# Patient Record
Sex: Male | Born: 1945
Health system: Southern US, Community
[De-identification: ages and names within clinical notes are randomized; demographics above are authoritative.]

## PROBLEM LIST (undated history)

## (undated) DIAGNOSIS — E119 Type 2 diabetes mellitus without complications: Secondary | ICD-10-CM

## (undated) DIAGNOSIS — F419 Anxiety disorder, unspecified: Secondary | ICD-10-CM

## (undated) DIAGNOSIS — K219 Gastro-esophageal reflux disease without esophagitis: Secondary | ICD-10-CM

## (undated) DIAGNOSIS — C801 Malignant (primary) neoplasm, unspecified: Secondary | ICD-10-CM

## (undated) DIAGNOSIS — E039 Hypothyroidism, unspecified: Secondary | ICD-10-CM

## (undated) DIAGNOSIS — Z8719 Personal history of other diseases of the digestive system: Secondary | ICD-10-CM

## (undated) DIAGNOSIS — I10 Essential (primary) hypertension: Secondary | ICD-10-CM

## (undated) HISTORY — PX: BACK SURGERY: SHX140

---

## 1989-02-22 HISTORY — PX: FRACTURE SURGERY: SHX138

## 1990-02-22 HISTORY — PX: HERNIA REPAIR: SHX51

## 2000-06-20 ENCOUNTER — Ambulatory Visit (HOSPITAL_COMMUNITY): Admission: RE | Admit: 2000-06-20 | Discharge: 2000-06-20 | Payer: Self-pay | Admitting: Gastroenterology

## 2004-06-01 ENCOUNTER — Ambulatory Visit (HOSPITAL_COMMUNITY): Admission: RE | Admit: 2004-06-01 | Discharge: 2004-06-02 | Payer: Self-pay | Admitting: Neurosurgery

## 2005-03-08 ENCOUNTER — Encounter: Admission: RE | Admit: 2005-03-08 | Discharge: 2005-06-06 | Payer: Self-pay | Admitting: Neurosurgery

## 2006-05-09 ENCOUNTER — Inpatient Hospital Stay (HOSPITAL_COMMUNITY): Admission: RE | Admit: 2006-05-09 | Discharge: 2006-05-10 | Payer: Self-pay | Admitting: Neurosurgery

## 2007-01-21 ENCOUNTER — Encounter: Admission: RE | Admit: 2007-01-21 | Discharge: 2007-01-21 | Payer: Self-pay | Admitting: Neurosurgery

## 2007-04-25 ENCOUNTER — Ambulatory Visit (HOSPITAL_COMMUNITY): Admission: RE | Admit: 2007-04-25 | Discharge: 2007-04-26 | Payer: Self-pay | Admitting: Neurosurgery

## 2007-06-30 ENCOUNTER — Encounter: Admission: RE | Admit: 2007-06-30 | Discharge: 2007-06-30 | Payer: Self-pay | Admitting: Neurosurgery

## 2007-07-13 ENCOUNTER — Observation Stay (HOSPITAL_COMMUNITY): Admission: RE | Admit: 2007-07-13 | Discharge: 2007-07-14 | Payer: Self-pay | Admitting: Neurosurgery

## 2009-10-16 ENCOUNTER — Ambulatory Visit (HOSPITAL_COMMUNITY): Admission: RE | Admit: 2009-10-16 | Discharge: 2009-10-17 | Payer: Self-pay | Admitting: Neurosurgery

## 2010-05-08 LAB — URINALYSIS, ROUTINE W REFLEX MICROSCOPIC
Bilirubin Urine: NEGATIVE
Glucose, UA: NEGATIVE mg/dL
Ketones, ur: NEGATIVE mg/dL
Nitrite: NEGATIVE
Protein, ur: NEGATIVE mg/dL
Specific Gravity, Urine: 1.019 (ref 1.005–1.030)
pH: 6 (ref 5.0–8.0)

## 2010-05-08 LAB — CBC
Hemoglobin: 13.8 g/dL (ref 13.0–17.0)
MCH: 29.7 pg (ref 26.0–34.0)
MCHC: 33.9 g/dL (ref 30.0–36.0)
MCV: 87.7 fL (ref 78.0–100.0)
RBC: 4.64 MIL/uL (ref 4.22–5.81)
RDW: 14.1 % (ref 11.5–15.5)
WBC: 5.5 10*3/uL (ref 4.0–10.5)

## 2010-05-08 LAB — GLUCOSE, CAPILLARY
Glucose-Capillary: 109 mg/dL — ABNORMAL HIGH (ref 70–99)
Glucose-Capillary: 162 mg/dL — ABNORMAL HIGH (ref 70–99)
Glucose-Capillary: 259 mg/dL — ABNORMAL HIGH (ref 70–99)
Glucose-Capillary: 278 mg/dL — ABNORMAL HIGH (ref 70–99)

## 2010-05-08 LAB — SURGICAL PCR SCREEN: Staphylococcus aureus: POSITIVE — AB

## 2010-05-08 LAB — BASIC METABOLIC PANEL
Creatinine, Ser: 1.2 mg/dL (ref 0.4–1.5)
GFR calc Af Amer: >60 mL/min (ref >60)
Potassium: 4.4 mEq/L (ref 3.5–5.1)
Sodium: 136 mEq/L (ref 135–145)

## 2010-05-08 LAB — PROTIME-INR: INR: 0.97 (ref 0.00–1.49)

## 2010-07-07 NOTE — Op Note (Signed)
[name], [name] NO.:  [id number]   MEDICAL RECORD NO.:  [id number]          PATIENT TYPE:  OBV   LOCATION:  3537                         FACILITY:  [facility]   PHYSICIAN:  Clydene Fake, M.D.  DATE OF BIRTH:  09-23-1945   DATE OF PROCEDURE:  07/13/2007  DATE OF DISCHARGE:                               OPERATIVE REPORT   PREOPERATIVE DIAGNOSIS:  Cord compression myelopathy, C4-C5 and C5-C6.   POSTOPERATIVE DIAGNOSIS:  Cord compression myelopathy, C4-C5 and C5-C6.   PROCEDURES:  Anterior cervical decompression and diskectomy and fusion  at C4-C5 and C5-C6 with LifeNet allograft bone, Trestle anterior  cervical plate.   SURGEON:  Clydene Fake, MD   ASSISTANT:  Dr. Ezzard Standing.   ANESTHESIA:  General endotracheal tube anesthesia.   ESTIMATED BLOOD LOSS:  Minimal.   BLOOD GIVEN:  None.   DRAINS:  None.   COMPLICATIONS:  None.   REASON FOR PROCEDURE:  The patient is a 65 year old gentleman who began  to have progressive trouble walking, clumsiness, and increasing  spasticity.  MRI of the C-spine shows spondylytic change, disk  herniation, central cord compression at C4-C5 and C5-C6 with cord  change.  The patient was brought in for decompression and fusion.   PROCEDURE IN DETAIL:  The patient was brought to the operating room, and  general anesthesia was induced.  The patient was placed in a 10-pound  Halter traction, prepped and draped in sterile fashion, a standard  incision, was injected with 10 mL of 1% lidocaine with epinephrine.  Incision was then made from the midline to the anterior border of the  sternocleidomastoid muscle on the left side.  Neck was incision taken  down to the platysma, and hemostasis was obtained with Bovie  cauterization.  The platysma was incised with Bovie and blunt dissection  taken through the anterior cervical fascia.  The anterior cervical spine  needle was placed in interspace.  X-ray was obtained showing this was  at  L4-L5 interspace.  Interspaces were incised with #15 blade and partial  diskectomy performed, and the needle was removed.  Longus coli muscles  were reflected laterally from C4 through C6, and self-retaining  retractors were placed, and diskectomy was continued at C4-C5 and C5-C6.  Anterior osteophytes were removed with rongeurs, and distraction pins  were placed in the C4 and C6.  Interspace was distracted.  Microscope  was brought in for microdissection.  Diskectomy was continued at C4-C5.  A high-speed drill was used to remove cartilaginous endplate and  osteophytes even 1 mm to 2 mm.  Kerrison rongeurs were used to remove  the posterior disk osteophyte and ligament decompressing the central  canal.  Bilateral foraminotomies were performed.  We had good  decompression of the central canal.  We measured height of disk space to  be 4 mm, and a 4-mm LifeNet allograft bone was tapped into place and  countersunk a few millimeters.  Attention was directed to C5-C6, and we  used a high-speed drill to remove cartilaginous endplates and  osteophytes and actually a few millimeters of the C5 vertebra __________  posteriorly.  We used Kerrison rongeurs to remove posterior disk  ligament and osteophytes, removing fragments of disk that were  __________ compressing the cord.  Some of these were slightly cephalad,  and we also pulled that out with a nerve hook.  When we were finished,  we had good central decompression and bilateral nerve roots were well  decompressed.  We measured the height of the disk space to 7 mm at each  level and a 7-mm left allograft bone was tapped into place.  Weight was  removed from traction.  Distraction was removed and distraction pins  removed.  Hemostasis was obtained with Gelfoam and thrombin binding.  Plates were __________ in good position.  Trestle anterior cervical  plate was placed over the anterior cervical spine.  Two screws placed in  C5 and two in the  C4, C5, and C6.  These were tightened down.  Lateral x-  rays were obtained showing good position of plate and screws, bone plug  at C4-C5, but we could not see C5-C6 graft due to body habitus and it  looked good intraoperatively.  Retractors removed.  Hemostasis obtained  with bipolar cauterization, Gelfoam, and thrombin.  Gelfoam was  irrigated out.  We irrigated with antibiotic solution.  We had good  hemostasis, and the platysma was closed with 3-0 Vicryl interrupted  sutures.  Subcutaneous tissue was closed with same.  Skin closed with  benzoin and Steri-Strips.  Dressing was placed.  The patient was placed  in a soft cervical collar, awoken from anesthesia, and transferred to  the recovery room in a stable condition.           ______________________________  Clydene Fake, M.D.     JRH/MEDQ  D:  07/13/2007  T:  07/14/2007  Job:  [id number]

## 2010-07-07 NOTE — Op Note (Signed)
[name], [name]             ACCOUNT NO.:  [id number]   MEDICAL RECORD NO.:  [id number]          PATIENT TYPE:  AMB   LOCATION:  SDS                          FACILITY:  [facility]   PHYSICIAN:  Clydene Fake, M.D.  DATE OF BIRTH:  [birth year]   DATE OF PROCEDURE:  DATE OF DISCHARGE:                               OPERATIVE REPORT   POSTOPERATIVE DIAGNOSIS:  Lumbar stenosis, spondylosis, prior surgery.   POSTOPERATIVE DIAGNOSES:  Lumbar stenosis, spondylosis, prior surgery.   PROCEDURE:  Redo bilateral decompressive laminectomies, foraminotomies  at L2-3, microdissection with microscope.   SURGEON:  Clydene Fake, M.D.   ANESTHESIA:  General endotracheal tube anesthesia.   ESTIMATED BLOOD LOSS:  Minimal.   BLOOD GIVEN:  None.   COMPLICATIONS:  None.   REASON FOR PROCEDURE:  The patient is a 65 year old gentleman who has  had 2 lumbar laminectomies in the past, doing well until recently.  Has  been having progressive neurogenic claudication with leg pain.Marland Kitchen  Epidural injections have helped but __________  not given any lasting  relief.  X-rays showed no instability with some multilevel degeneration  and an MRI shows severe stenosis at the L2-3 level.  The patient is  brought in for decompression.   PROCEDURE IN DETAIL:  The patient was brought to the operating room and  general anesthesia induced.  The patient was placed in supine position  in a Wilson frame with all pressure points padded.  The patient was  prepped and draped in sterile fashion.  The subcutaneous tissue was  injected with 10 mL 0.25% Marcaine and 10 mL lidocaine with epinephrine.  An incision was then made at the site of previous scar incision and the  upper aspect of it taken down to the fascia.  Hemostasis was obtained  Bovie cauterization.  The fascia was incised on the left side and  subperiosteal dissection was carried down to the L2 and L3 spinous  process, __________  facet.  A marker was placed at  the interspace.  X-  rays were obtained that confirmed our position at L2-3.  Then we exposed  the contralateral side and placed a self-retaining retractor.  The  microscope was brought in for microdissection at this point and a high-  speed drill was used to start a semi hemilaminectomy on the left side at  L2-L3 and a medial facetectomy was also performed.  Kerrison punches  then were used to finish this.  The ligament flavum was removed.  L3 was  also removed.  Foraminotomy done over the 3 root.  We decompressed the  deep canal.  There was significant lateral recess stenosis throughout  and when we were finished we had good decompression.  We explored the  nerve roots.  The 3 nerve was well decompressed.  The foramen was very  narrow.  We continued our decompression foraminotomy to decompress the 2  nerve root.  We used curettes also to decompress the foramen and when  were finished we had a good decompression of the L2 root.  Going out at  its foramen was a disk bulge there.  We did not enter the disk space.  Attention was then taken to the right side.  A semi hemilaminectomy was  performed, decompressing the thecal sac at the underside.  Foraminotomies performed and we __________  canal and the 2 and 3 nerve  roots were decompressed.  When we were finished we had good central and  lateral decompression.  The central canal and 2 and 3 roots were well  decompressed.  We irrigated with antibiotic solution.  We had good  hemostasis.  Fascia closed with 0 Vicryl interrupted sutures.  Subcutaneous tissue closed with 2-0 and 3-0 Vicryl interrupted suture.  Skin closed with benzoin.  Steri-Strips and a dressing was placed.  The  patient was placed back in the supine position  and returned to the  recovery room in stable condition.           ______________________________  Clydene Fake, M.D.     JRH/MEDQ  D:  04/25/2007  T:  04/25/2007  Job:  [id number]

## 2010-07-10 NOTE — Op Note (Signed)
[name], [name] NO.:  [id number]   MEDICAL RECORD NO.:  [id number]          PATIENT TYPE:  OIB   LOCATION:  2899                         FACILITY:  [facility]   PHYSICIAN:  Clydene Fake, M.D.  DATE OF BIRTH:  05-Mar-1945   DATE OF PROCEDURE:  06/01/2004  DATE OF DISCHARGE:                                 OPERATIVE REPORT   DIAGNOSIS:  Herniated nucleus pulposus right L3-4.   POSTOPERATIVE DIAGNOSIS:  Herniated nucleus pulposus right L3-4.   PROCEDURE:  Right L3-4 semihemilanectomy and diskectomy, microdissection  with microscope.   SURGEON:  Clydene Fake, M.D.   ASSISTANT:  Cristi Loron, M.D.   ANESTHESIA:  General endotracheal tube anesthesia.   ESTIMATED BLOOD LOSS:  Minimal.   BLOOD GIVEN:  None.   DRAINS:  None.   COMPLICATIONS:  None.   REASON FOR PROCEDURE:  The patient is a 65 year old gentleman who has been  having back and right leg pain radiating in the L4 distribution, with  positive straight leg raising and decreased deep tendon reflexes of the  right knee, and MRI showing spondylitic changes of L3-4 and broad-based disc  bulge and a large extruded fragment to the right side.  The patient was  brought in for diskectomy.   PROCEDURE IN DETAIL:  The patient was brought into the operating room and  general anesthesia induced.  The patient was placed in a prone position on  the Wilson frame, with all pressure points padded.  The patient was prepped  and draped in a sterile fashion.  The site of incision was injected with 10  cc of 1% lidocaine with epinephrine.  A needle was placed in the interspace.  X-ray was obtained, showing this was pointing at the L3-4 interspace.  Incision was then made centered where the needle was placed.  Incision was  taken down to the fascia.  Hemostasis was obtained with Bovie cauterization.  Subperiosteal dissection was done over the L3 and L4 spinous processes and  lamina out to the facet.  A  self-retaining retractor was placed.  Marker was  placed at the interspace.  Another x-ray was obtained confirming our  positioning at L3-4.  Microscope was brought in for microdissection.  A high-  speed drill was used to start a semihemilaminectomy, and medial facetectomy  was completed with Kerrison punches.  The ligamentum flavum was removed and  a  foraminotomy was done over the L4 root.  We explored the epidural space,  and the extruded fragment was seen up under the L4 root.  Nerve root  retractor was used, and we started removing the disc herniation which was  friable while it went up the sucker and came out in small pieces, but we  continued the decompression.  We decompressed up to the disc space.  The  disc space was then incised with a 15 blade and diskectomy performed with  pituitary rongeurs and curets.  Osteophytes were used to remove some  osteophytes.  When we were finished, we had a good decompression of the  central canal and the L4 root, and the L3  root was checked and that was  decompression.  We felt around under the L4 root and more medially behind  the dura and posterior to the L4 vertebral body and could not find any more  fragments of disc.  Hemostasis was obtained with bipolar cauterization and  Gelfoam with thrombin.  Gelfoam was irrigated out.  We irrigated with  antibiotics solution.  We had good hemostasis.  The retractor was removed,  and the fascia closed with 0 Vicryl interrupted suture, the subcutaneous  tissue closed with 0, 2-0, and 3-0 Vicryl interrupted suture, and the skin  closed with Benzoin and Steri-Strips.  Dressing was placed.  The patient was  placed back to a supine position, awakened from anesthesia, and transferred  to the recovery room in stable condition.      JRH/MEDQ  D:  06/01/2004  T:  06/01/2004  Job:  [id number]

## 2010-07-10 NOTE — Op Note (Signed)
[place]  Patient:    [name], [name]                    MRN: [id number] Proc. Date: 06/20/00 Adm. Date:  [id number] Attending:  Nelda Marseille CC:         Petra Kuba, M.D.  Veverly Fells. Althe[place], M.D.   Operative Report  PROCEDURE PERFORMED:  Colonoscopy.  ENDOSCOPIST:  Petra Kuba, M.D.  INDICATIONS FOR PROCEDURE:  Patient due for colonic screening with increased gas and bloating.  Consent was signed after risks, benefits, methods, and options were thoroughly discussed multiple times in the past.  MEDICATIONS USED:  Demerol 75 mg, Versed 7.5 mg.  DESCRIPTION OF PROCEDURE:  Rectal inspection was pertinent for external hemorrhoids.  Digital exam was negative.  The video colonoscope was inserted and easily advanced around the colon to the cecum.  This did not require any abdominal pressure or any position changes.  The cecum was identified by the appendiceal orifice and the ileocecal valve.  In fact, the scope was inserted a short ways to the terminal ileum which was normal.  Photodocumentation was obtained.  The scope was then slowly withdrawn.  The right side of the colon had a fair prep with stool adherent to the wall which could not be washed off, but no large lesions were seen.  The scope was then slowly withdrawn.  The rest of the prep was adequate but on slow withdrawal back to the rectum, no abnormalities were seen, specifically no diverticula, polyps, masses or signs of colitis.  Once back in the rectum, the scope was retroflexed revealing some tiny internal hemorrhoids.  The scope was straightened and readvanced a short ways up the sigmoid.  Air was suctioned, scope removed.  The patient tolerated the procedure well.  There was no obvious immediate complication.  ENDOSCOPIC DIAGNOSIS: 1. Internal and external hemorrhoids. 2. Otherwise within normal limits to the terminal ileum.  PLAN:  Await trial of Donnatal instead  of the Librax.  Would increase things like Gas-X and Phazyme since they help.  We discussed how multiple of his medicines or just the diabetes may be playing a role but repeat screening in five to 10 years.  I will be happy to see back p.r.n. or in two months to see how the Donnatal is working and make sure no further work-up plans are needed. DD:  06/20/00 TD:  06/20/00 Job: [id number]

## 2010-07-10 NOTE — Op Note (Signed)
NAMEBRYLAN, DEC NO.:  [id number]   MEDICAL RECORD NO.:  [id number]          PATIENT TYPE:  INP   LOCATION:  5041                         FACILITY:  [facility]   PHYSICIAN:  Clydene Fake, M.D.  DATE OF BIRTH:  [birth year]   DATE OF PROCEDURE:  05/09/2006  DATE OF DISCHARGE:                               OPERATIVE REPORT   DIAGNOSIS:  Left L2-3 far-lateral herniated nucleus pulposus.   POSTOPERATIVE DIAGNOSES:  Left L2-3 far-lateral herniated nucleus  pulposus.   PROCEDURE:  Left L2-3 far-lateral diskectomy, microsurgical microscope.   SURGEON:  Clydene Fake, M.D.   ASSISTANT:  Hilda Lias, M.D.   ANESTHESIA:  General endotracheal tube anesthesia.   ESTIMATED BLOOD LOSS:  Minimal.   BLOOD GIVEN:  None.   DRAINS:  None.   COMPLICATIONS:  None.   REASON FOR PROCEDURE:  The patient is a 65 year old gentleman who a  couple of years ago underwent a right 3-4 diskectomy and developed back  and left leg pain, weakness and numbness.  A MRI was done showing a  large bilateral disk herniation 2-3 on the left compressing the 2 root.  The patient is brought in for decompression.   PROCEDURE IN DETAIL:  The patient was brought to the operating room and  general anesthesia induced.  The patient was placed in the prone  position on the Wilson frame with all pressure points padded.  The  patient was prepped and draped in a sterile fashion.  The site of  incision was injected with 20 mL of 1% lidocaine with epinephrine.  An  incision was then made at the site of the previous scar and extended  cephalad for another level incision taken down through the fascia.  Hemostasis was obtained with Bovie cauterization.  The fascia was  incised over the L2 spinous processes and subperiosteal dissection was  done over the spinous processes and laminae up to the facets, dissected  out over the pars and a superior marker was placed below the alignment.  X-ray was taken  showing that our superior marker was at the L1-2 disk  space pointing at the pedicle of L2 and the bottom marker was at the L2-  3 interspace pointing at the L3 pedicle and it showed to be at the right  position.  I dissected a little more laterally until I could see the  pars, and a high-speed drill was used to drill down the pars and the  superior lateral facet slightly.  The microscope was brought in for  microdissection.  At this point,  Kerrison punches were used to continue  dissection through the lateral part of the pars which was dissected with  nerve hooks through the fascia and L2 nerve root.  We decompressed the  ligaments over the nerve root and then decompressed both medially and  inferiorly and then dissecting down out of the disk space which had a  large subligamentous disk herniation causing a lot of pressure on the L2  root.  We opened up the disk space and removed large fragments of disk  herniation and even pulled some  more fragments out from behind medially  which was superior to the disk space and more fragments out laterally.  Doing this, we were able to decompress the L2 root.  Dissected down to  the disk space, did not end with the disk space, but continued to remove  __________ disk space disk material.  When we were finished, we had good  decompression of L2 root.  We did follow it going medial and laterally  with nerve root showing no compression of the root.  After hemostasis  with Gelfoam and thrombin, we irrigated that out with antibiotic  solution.  When we had good hemostasis, the retractors were removed.  The fascia closed with 0 Vicryl interrupted suture.  The subcutaneous  tissue closed with 2-0 and 3-0 Vicryl interrupted sutures.  Skin closed  with benzoin and Steri-Strips.  Dressing was placed.  The patient was  placed back in the supine position, awakened from anesthesia and  transferred to the recovery room in stable condition.            ______________________________  Clydene Fake, M.D.     JRH/MEDQ  D:  05/09/2006  T:  05/09/2006  Job:  [id number]

## 2010-11-16 LAB — URINALYSIS, ROUTINE W REFLEX MICROSCOPIC
Bilirubin Urine: NEGATIVE
Glucose, UA: 500 — AB
Ketones, ur: 15 — AB
Specific Gravity, Urine: 1.025
pH: 6

## 2010-11-16 LAB — CBC
HCT: 49.1
Hemoglobin: 16.4
MCV: 82.2
Platelets: 206
RDW: 15.8 — ABNORMAL HIGH

## 2010-11-16 LAB — BASIC METABOLIC PANEL
BUN: 23
CO2: 26
Chloride: 99
Glucose, Bld: 232 — ABNORMAL HIGH
Potassium: 4.4

## 2010-11-18 LAB — BASIC METABOLIC PANEL
BUN: 14
Chloride: 99
GFR calc non Af Amer: >60
Glucose, Bld: 83
Potassium: 3.8
Sodium: 138

## 2010-11-18 LAB — URINALYSIS, ROUTINE W REFLEX MICROSCOPIC
Glucose, UA: NEGATIVE
Ketones, ur: NEGATIVE
Nitrite: NEGATIVE
Specific Gravity, Urine: 1.016
pH: 7.5

## 2010-11-18 LAB — CBC
HCT: 47.1
Hemoglobin: 15.7
MCV: 84.3
Platelets: 239
WBC: 7

## 2010-11-18 LAB — APTT: aPTT: 26

## 2014-02-22 HISTORY — PX: EYE SURGERY: SHX253

## 2017-08-01 ENCOUNTER — Encounter (HOSPITAL_COMMUNITY): Payer: Self-pay | Admitting: Emergency Medicine

## 2017-08-01 ENCOUNTER — Emergency Department (HOSPITAL_COMMUNITY): Payer: Medicare Other

## 2017-08-01 ENCOUNTER — Other Ambulatory Visit: Payer: Self-pay

## 2017-08-01 ENCOUNTER — Emergency Department (HOSPITAL_COMMUNITY)
Admission: EM | Admit: 2017-08-01 | Discharge: 2017-08-01 | Disposition: A | Discharge status: Home / Self Care | Payer: Medicare Other | Attending: Emergency Medicine | Admitting: Emergency Medicine

## 2017-08-01 DIAGNOSIS — M1990 Unspecified osteoarthritis, unspecified site: Secondary | ICD-10-CM | POA: Diagnosis not present

## 2017-08-01 DIAGNOSIS — X500XXA Overexertion from strenuous movement or load, initial encounter: Secondary | ICD-10-CM | POA: Insufficient documentation

## 2017-08-01 DIAGNOSIS — Y9389 Activity, other specified: Secondary | ICD-10-CM | POA: Diagnosis not present

## 2017-08-01 DIAGNOSIS — S4992XA Unspecified injury of left shoulder and upper arm, initial encounter: Secondary | ICD-10-CM | POA: Diagnosis present

## 2017-08-01 DIAGNOSIS — Y999 Unspecified external cause status: Secondary | ICD-10-CM | POA: Diagnosis not present

## 2017-08-01 DIAGNOSIS — Y929 Unspecified place or not applicable: Secondary | ICD-10-CM | POA: Diagnosis not present

## 2017-08-01 DIAGNOSIS — E119 Type 2 diabetes mellitus without complications: Secondary | ICD-10-CM | POA: Insufficient documentation

## 2017-08-01 DIAGNOSIS — S46002A Unspecified injury of muscle(s) and tendon(s) of the rotator cuff of left shoulder, initial encounter: Secondary | ICD-10-CM | POA: Diagnosis not present

## 2017-08-01 DIAGNOSIS — I10 Essential (primary) hypertension: Secondary | ICD-10-CM | POA: Diagnosis not present

## 2017-08-01 HISTORY — DX: Malignant (primary) neoplasm, unspecified: C80.1

## 2017-08-01 HISTORY — DX: Essential (primary) hypertension: I10

## 2017-08-01 HISTORY — DX: Type 2 diabetes mellitus without complications: E11.9

## 2017-08-01 MED ORDER — METHOCARBAMOL 500 MG PO TABS: 500.0000 mg | ORAL_TABLET | Freq: Two times a day (BID) | ORAL | 0 refills | Status: DC

## 2017-08-01 NOTE — ED Provider Notes (Signed)
Patient placed in Quick Look pathway, seen and evaluated   Chief Complaint: left shoulder and arm pain  HPI:  [name] is a 72 y.o. male with hx of HTN, DM who presents to the ED with left shoulder and arm pain that started 3 days ago after he picked up his grandchild that weighs 30 pounds. The pain has been persistent since then. The pain increases with movement. Patient reports taking NSAIDS that has helped some. Patient reports having similar pain in the same area before but pain went away with Aleve.   ROS: M/S: left shoulder and arm pain  Physical Exam:  BP (!) 153/72 (BP Location: Left Arm)   Pulse 94   Temp 98.5 F (36.9 C) (Oral)   Resp 16   Ht 5\' 8"  (1.727 m)   Wt 88.5 kg (195 lb)   SpO2 96%   BMI 29.65 kg/m    Gen: No distress  Neuro: Awake and Alert  Skin: Warm and dry  M/S: Radial pulse 2+, adequate circulation, pain with palpation and range of motion of the left shoulder over the [facility].   Initiation of care has begun. The patient has been counseled on the process, plan, and necessity for staying for the completion/evaluation, and the remainder of the medical screening examination    Ashley Murrain, NP 08/01/17 [place], [place], MD 08/03/17 [phone]

## 2017-08-01 NOTE — ED Notes (Signed)
Patient transported to X-ray 

## 2017-08-01 NOTE — ED Notes (Signed)
See EDP assessment / note.   

## 2017-08-01 NOTE — ED Notes (Signed)
Pt verbalized understanding discharge instructions and denies any further needs or questions at this time. VS stable, ambulatory and steady gait.   

## 2017-08-01 NOTE — ED Triage Notes (Signed)
Pt c/o pain in left shoulder and arm onset 2 days ago.  Describes pain as sharp.  Pain increases with movement and when laying down.  Pt st's he tried to pick up his 30lb granddaughter prior to pain starting

## 2017-08-01 NOTE — ED Provider Notes (Signed)
[facility] Provider Note   CSN: [id number] Arrival date & time: 08/01/17  1457     History   Chief Complaint Chief Complaint  Patient presents with  . Shoulder Pain    HPI [name] is a 72 y.o. male.  Pt comes in with c/o left shoulder pain that started 2 days ago after lifting his granddaughter. Denies numbness or weakness. Has taken aleve and ibuprofen with minimal relief. Denies any problems with rom. He has had the pain before but it usually resolves with aleve     Past Medical History:  Diagnosis Date  . Cancer ([place])   . Diabetes mellitus without complication ([place])   . Hypertension     There are no active problems to display for this patient.   Past Surgical History:  Procedure Laterality Date  . BACK SURGERY    . FRACTURE SURGERY          Home Medications    Prior to Admission medications   Not on File    Family History No family history on file.  Social History Social History   Tobacco Use  . Smoking status: Never Smoker  . Smokeless tobacco: Never Used  Substance Use Topics  . Alcohol use: Never    Frequency: Never  . Drug use: Never     Allergies   Codeine and Morphine and related   Review of Systems Review of Systems  All other systems reviewed and are negative.    Physical Exam Updated Vital Signs BP (!) 153/72 (BP Location: Left Arm)   Pulse 94   Temp 98.5 F (36.9 C) (Oral)   Resp 16   Ht 5\' 8"  (1.727 m)   Wt 88.5 kg (195 lb)   SpO2 96%   BMI 29.65 kg/m   Physical Exam  Constitutional: He is oriented to person, place, and time. He appears well-developed and well-nourished.  Eyes: EOM are normal.  Cardiovascular: Normal rate.  Pulmonary/Chest: Effort normal and breath sounds normal.  Abdominal: Soft. Bowel sounds are normal.  Musculoskeletal: Normal range of motion.  Tender to the ac joint and posterior shoulder. Pt has full rom  Neurological: He is alert and  oriented to person, place, and time.  Skin: Skin is warm and dry.  Psychiatric: He has a normal mood and affect.  Nursing note and vitals reviewed.    ED Treatments / Results  Labs (all labs ordered are listed, but only abnormal results are displayed) Labs Reviewed - No data to display  EKG None  Radiology Dg Shoulder Left  Result Date: 08/01/2017 CLINICAL DATA:  72 year old male with history of left shoulder pain for the past 3 weeks. No history of injury. EXAM: LEFT SHOULDER - 2+ VIEW COMPARISON:  No priors. FINDINGS: There is no evidence of fracture or dislocation. Degenerative changes of osteoarthritis are noted in the glenohumeral and acromioclavicular joints. Humeral head is high riding, suggesting underlying rotator cuff pathology. Density at the inferior aspect of the glenoid labrum. Soft tissues are unremarkable. IMPRESSION: 1. Density along the inferior aspect of the glenoid labrum, likely chronic, suggestive of remote labral injury, or possible loose body in the joint. This could be further evaluated with nonemergent MRI of the shoulder if clinically appropriate. 2. Negative for acute fracture or dislocation. 3. High-riding humeral head, suggestive of rotator cuff pathology. 4. Osteoarthritis in glenohumeral and acromioclavicular joints. Electronically Signed   By: Vinnie Langton M.D.   On: 08/01/2017 16:30  Procedures Procedures (including critical care time)  Medications Ordered in ED Medications - No data to display   Initial Impression / Assessment and Plan / ED Course  I have reviewed the triage vital signs and the nursing notes.  Pertinent labs & imaging results that were available during my care of the patient were reviewed by me and considered in my medical decision making (see chart for details).     Discussed finding with pt. Discussed follow up with ortho and pcp  Final Clinical Impressions(s) / ED Diagnoses   Final diagnoses:  None    ED Discharge  Orders    None       Glendell Docker, NP 08/01/17 1707    Nat Christen, MD 08/03/17 1037

## 2017-09-12 ENCOUNTER — Other Ambulatory Visit: Payer: Self-pay

## 2017-09-12 ENCOUNTER — Emergency Department (HOSPITAL_COMMUNITY)
Admission: EM | Admit: 2017-09-12 | Discharge: 2017-09-12 | Disposition: A | Discharge status: Home / Self Care | Payer: Medicare Other | Attending: Emergency Medicine | Admitting: Emergency Medicine

## 2017-09-12 ENCOUNTER — Emergency Department (HOSPITAL_COMMUNITY): Payer: Medicare Other

## 2017-09-12 ENCOUNTER — Encounter (HOSPITAL_COMMUNITY): Payer: Self-pay | Admitting: Emergency Medicine

## 2017-09-12 DIAGNOSIS — M79604 Pain in right leg: Secondary | ICD-10-CM | POA: Diagnosis present

## 2017-09-12 DIAGNOSIS — I10 Essential (primary) hypertension: Secondary | ICD-10-CM | POA: Diagnosis not present

## 2017-09-12 DIAGNOSIS — M5431 Sciatica, right side: Secondary | ICD-10-CM | POA: Insufficient documentation

## 2017-09-12 DIAGNOSIS — E119 Type 2 diabetes mellitus without complications: Secondary | ICD-10-CM | POA: Insufficient documentation

## 2017-09-12 MED ORDER — METHOCARBAMOL 500 MG PO TABS: 500.0000 mg | ORAL_TABLET | Freq: Two times a day (BID) | ORAL | 0 refills | Status: DC

## 2017-09-12 MED ORDER — HYDROCODONE-ACETAMINOPHEN 5-325 MG PO TABS: 1.0000 | ORAL_TABLET | Freq: Four times a day (QID) | ORAL | 0 refills | Status: DC | PRN

## 2017-09-12 NOTE — ED Notes (Signed)
ED Provider at bedside. 

## 2017-09-12 NOTE — ED Notes (Signed)
Pt verbalized understanding of discharge instructions and denies any further questions at this time.   

## 2017-09-12 NOTE — ED Triage Notes (Signed)
Pt. Stated, Im having rt. Leg pain since yesterday, I also have a rod in this leg. It doesn't hurt unless Im moving it.
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
## 2017-09-12 NOTE — Discharge Instructions (Addendum)
Take Robaxin and Norco as needed as prescribed for pain. Do not drive while taking these medications. Apply a warm compress to your lower back for 20 minutes at a time. Follow up with your doctor.  Your x-ray today is normal, sciatica is a problem with muscle spasms and nerve compression.  The medications prescribed today should help relax the muscles as well as help with your pain however you should follow-up with your family doctor to recheck and discuss possible physical therapy.

## 2017-09-12 NOTE — ED Notes (Signed)
Patient transported to X-ray 

## 2017-11-30 ENCOUNTER — Other Ambulatory Visit: Payer: Self-pay | Admitting: Family Medicine

## 2017-12-01 ENCOUNTER — Other Ambulatory Visit: Payer: Self-pay | Admitting: Family Medicine

## 2017-12-01 DIAGNOSIS — M5430 Sciatica, unspecified side: Secondary | ICD-10-CM

## 2017-12-03 ENCOUNTER — Ambulatory Visit
Admission: RE | Admit: 2017-12-03 | Discharge: 2017-12-03 | Disposition: A | Discharge status: Home / Self Care | Payer: Medicare Other | Source: Ambulatory Visit | Attending: Family Medicine | Admitting: Family Medicine

## 2017-12-03 DIAGNOSIS — M5430 Sciatica, unspecified side: Secondary | ICD-10-CM

## 2017-12-20 ENCOUNTER — Other Ambulatory Visit: Payer: Self-pay | Admitting: Neurosurgery

## 2017-12-22 NOTE — Pre-Procedure Instructions (Signed)
[facility]  12/22/2017      [id number] [address], [place] AT [address] [place] [address] Phone: [phone] Fax: [phone]    Your procedure is scheduled on Wednesday November 6th.  Report to [facility] Admitting at 11:15 A.M.  Call this number if you have problems the morning of surgery:  [phone]   Remember:  Do not eat or drink after midnight.     Take these medicines the morning of surgery with A SIP OF WATER  acetaminophen (TYLENOL)  amLODipine (NORVASC)  carvedilol (COREG) HYDROcodone-acetaminophen (NORCO/VICODIN)  levothyroxine (SYNTHROID, LEVOTHROID) loratadine (CLARITIN) methocarbamol (ROBAXIN) omeprazole (PRILOSEC)  Follow your surgeon's instructions on when to stop Asprin.  If no instructions were given by your surgeon then you will need to call the office to get those instructions.    7 days prior to surgery STOP taking any Aspirin(unless otherwise instructed by your surgeon), Aleve, Naproxen, Ibuprofen, Motrin, Advil, Goody's, BC's, all herbal medications, fish oil, and all vitamins   HOW TO MANAGE YOUR DIABETES BEFORE AND AFTER SURGERY  Why is it important to control my blood sugar before and after surgery? . Improving blood sugar levels before and after surgery helps healing and can limit problems. . A way of improving blood sugar control is eating a healthy diet by: o  Eating less sugar and carbohydrates o  Increasing activity/exercise o  Talking with your doctor about reaching your blood sugar goals . High blood sugars (greater than 180 mg/dL) can raise your risk of infections and slow your recovery, so you will need to focus on controlling your diabetes during the weeks before surgery. . Make sure that the doctor who takes care of your diabetes knows about your planned surgery including the date and location.  How do I manage my blood sugar  before surgery? . Check your blood sugar at least 4 times a day, starting 2 days before surgery, to make sure that the level is not too high or low. o Check your blood sugar the morning of your surgery when you wake up and every 2 hours until you get to the Short Stay unit. . If your blood sugar is less than 70 mg/dL, you will need to treat for low blood sugar: o Do not take insulin. o Treat a low blood sugar (less than 70 mg/dL) with  cup of clear juice (cranberry or apple), 4 glucose tablets, OR glucose gel. Recheck blood sugar in 15 minutes after treatment (to make sure it is greater than 70 mg/dL). If your blood sugar is not greater than 70 mg/dL on recheck, call [phone]  for further instructions. . Report your blood sugar to the short stay nurse when you get to Short Stay.  . If you are admitted to the hospital after surgery: o Your blood sugar will be checked by the staff and you will probably be given insulin after surgery (instead of oral diabetes medicines) to make sure you have good blood sugar levels. o The goal for blood sugar control after surgery is 80-180 mg/dL.     WHAT DO I DO ABOUT MY DIABETES MEDICATION?   Marland Kitchen Do not take oral diabetes medicines (pills):  glimepiride (AMARYL) or metFORMIN (GLUCOPHAGE-XR) the morning of surgery.  . THE NIGHT BEFORE SURGERY, take 20 units of Lantus insulin. Take your usual dose of Byetta  . The day of surgery, do not  take other diabetes injectables, including Byetta (exenatide), Bydureon (exenatide ER), Victoza (liraglutide), or Trulicity (dulaglutide).     Do not wear jewelry, make-up or nail polish.  Do not wear lotions, powders, or perfumes, or deodorant.  Do not shave 48 hours prior to surgery.  Men may shave face and neck.  Do not bring valuables to the hospital.  [facility] is not responsible for any belongings or valuables.  Contacts, dentures or bridgework may not be worn into surgery.  Leave your suitcase in the car.   After surgery it may be brought to your room.  For patients admitted to the hospital, discharge time will be determined by your treatment team.  Patients discharged the day of surgery will not be allowed to drive home.   [facility]- Preparing For Surgery  Before surgery, you can play an important role. Because skin is not sterile, your skin needs to be as free of germs as possible. You can reduce the number of germs on your skin by washing with CHG (chlorahexidine gluconate) Soap before surgery.  CHG is an antiseptic cleaner which kills germs and bonds with the skin to continue killing germs even after washing.    Oral Hygiene is also important to reduce your risk of infection.  Remember - BRUSH YOUR TEETH THE MORNING OF SURGERY WITH YOUR REGULAR TOOTHPASTE  Please do not use if you have an allergy to CHG or antibacterial soaps. If your skin becomes reddened/irritated stop using the CHG.  Do not shave (including legs and underarms) for at least 48 hours prior to first CHG shower. It is OK to shave your face.  Please follow these instructions carefully.   1. Shower the NIGHT BEFORE SURGERY and the MORNING OF SURGERY with CHG.   2. If you chose to wash your hair, wash your hair first as usual with your normal shampoo.  3. After you shampoo, rinse your hair and body thoroughly to remove the shampoo.  4. Use CHG as you would any other liquid soap. You can apply CHG directly to the skin and wash gently with a scrungie or a clean washcloth.   5. Apply the CHG Soap to your body ONLY FROM THE NECK DOWN.  Do not use on open wounds or open sores. Avoid contact with your eyes, ears, mouth and genitals (private parts). Wash Face and genitals (private parts)  with your normal soap.  6. Wash thoroughly, paying special attention to the area where your surgery will be performed.  7. Thoroughly rinse your body with warm water from the neck down.  8. DO NOT shower/wash with your normal soap after using  and rinsing off the CHG Soap.  9. Pat yourself dry with a CLEAN TOWEL.  10. Wear CLEAN PAJAMAS to bed the night before surgery, wear comfortable clothes the morning of surgery  11. Place CLEAN SHEETS on your bed the night of your first shower and DO NOT SLEEP WITH PETS.    Day of Surgery: Shower as stated above. Do not apply any deodorants/lotions.  Please wear clean clothes to the hospital/surgery center.   Remember to brush your teeth WITH YOUR REGULAR TOOTHPASTE.   Please read over the following fact sheets that you were given.

## 2017-12-23 ENCOUNTER — Encounter (HOSPITAL_COMMUNITY)
Admission: RE | Admit: 2017-12-23 | Discharge: 2017-12-23 | Disposition: A | Discharge status: Home / Self Care | Payer: Medicare Other | Source: Ambulatory Visit | Attending: Neurosurgery | Admitting: Neurosurgery

## 2017-12-23 ENCOUNTER — Encounter (HOSPITAL_COMMUNITY): Payer: Self-pay

## 2017-12-23 ENCOUNTER — Other Ambulatory Visit: Payer: Self-pay

## 2017-12-23 DIAGNOSIS — E119 Type 2 diabetes mellitus without complications: Secondary | ICD-10-CM | POA: Diagnosis not present

## 2017-12-23 DIAGNOSIS — I10 Essential (primary) hypertension: Secondary | ICD-10-CM | POA: Diagnosis not present

## 2017-12-23 DIAGNOSIS — Z01818 Encounter for other preprocedural examination: Secondary | ICD-10-CM | POA: Diagnosis present

## 2017-12-23 HISTORY — DX: Hypothyroidism, unspecified: E03.9

## 2017-12-23 HISTORY — DX: Anxiety disorder, unspecified: F41.9

## 2017-12-23 LAB — BASIC METABOLIC PANEL
Anion gap: 5 (ref 5–15)
BUN: 16 mg/dL (ref 8–23)
CO2: 28 mmol/L (ref 22–32)
Calcium: 9.7 mg/dL (ref 8.9–10.3)
Chloride: 108 mmol/L (ref 98–111)
Creatinine, Ser: 1.13 mg/dL (ref 0.61–1.24)
GFR calc Af Amer: >60 mL/min (ref >60)
GLUCOSE: 72 mg/dL (ref 70–99)
Potassium: 4.1 mmol/L (ref 3.5–5.1)
SODIUM: 141 mmol/L (ref 135–145)

## 2017-12-23 LAB — SURGICAL PCR SCREEN
MRSA, PCR: NEGATIVE
STAPHYLOCOCCUS AUREUS: POSITIVE — AB

## 2017-12-23 LAB — CBC
HCT: 42.3 % (ref 39.0–52.0)
HEMOGLOBIN: 13.9 g/dL (ref 13.0–17.0)
MCH: 29.3 pg (ref 26.0–34.0)
MCHC: 32.9 g/dL (ref 30.0–36.0)
MCV: 89.1 fL (ref 80.0–100.0)
NRBC: 0 % (ref 0.0–0.2)
Platelets: 194 10*3/uL (ref 150–400)
RBC: 4.75 MIL/uL (ref 4.22–5.81)
RDW: 13.6 % (ref 11.5–15.5)
WBC: 6.2 10*3/uL (ref 4.0–10.5)

## 2017-12-23 LAB — HEMOGLOBIN A1C
HEMOGLOBIN A1C: 6.7 % — AB (ref 4.8–5.6)
Mean Plasma Glucose: 145.59 mg/dL

## 2017-12-23 LAB — GLUCOSE, CAPILLARY: GLUCOSE-CAPILLARY: 79 mg/dL (ref 70–99)

## 2017-12-23 NOTE — Progress Notes (Signed)
I called a prescription for Mupirocin ointment to  [facility] [address] in [place], [place]

## 2017-12-23 NOTE — Progress Notes (Signed)
PCP - Dr. Lawerance Cruel Cardiologist - denies  Chest x-ray - N/A EKG - 12/23/17 Stress Test - denies ECHO - denies Cardiac Cath - denies  Sleep Study - patient denies ever having sleep study  Fasting Blood Sugar - 120 Checks Blood Sugar : once daily  Aspirin Instructions: patient instructed to stop Aspirin 5 days prior to surgery. Patient states his last dose was 12/15/17.  Anesthesia review: yes- EKG review  Patient denies shortness of breath, fever, cough and chest pain at PAT appointment   Patient verbalized understanding of instructions that were given to them at the PAT appointment. Patient was also instructed that they will need to review over the PAT instructions again at home before surgery.

## 2017-12-27 ENCOUNTER — Other Ambulatory Visit: Payer: Self-pay | Admitting: Neurosurgery

## 2017-12-28 ENCOUNTER — Ambulatory Visit (HOSPITAL_COMMUNITY): Payer: Medicare Other

## 2017-12-28 ENCOUNTER — Ambulatory Visit (HOSPITAL_COMMUNITY): Payer: Medicare Other | Admitting: Physician Assistant

## 2017-12-28 ENCOUNTER — Encounter (HOSPITAL_COMMUNITY): Payer: Self-pay | Admitting: *Deleted

## 2017-12-28 ENCOUNTER — Ambulatory Visit (HOSPITAL_COMMUNITY): Payer: Medicare Other | Admitting: Anesthesiology

## 2017-12-28 ENCOUNTER — Ambulatory Visit (HOSPITAL_COMMUNITY)
Admission: RE | Admit: 2017-12-28 | Discharge: 2017-12-29 | Disposition: A | Discharge status: Home / Self Care | Payer: Medicare Other | Source: Ambulatory Visit | Attending: Neurosurgery | Admitting: Neurosurgery

## 2017-12-28 ENCOUNTER — Ambulatory Visit (HOSPITAL_COMMUNITY)
Admission: RE | Disposition: A | Discharge status: Home / Self Care | Payer: Self-pay | Source: Ambulatory Visit | Attending: Neurosurgery

## 2017-12-28 DIAGNOSIS — M5116 Intervertebral disc disorders with radiculopathy, lumbar region: Secondary | ICD-10-CM | POA: Insufficient documentation

## 2017-12-28 DIAGNOSIS — M48061 Spinal stenosis, lumbar region without neurogenic claudication: Secondary | ICD-10-CM | POA: Insufficient documentation

## 2017-12-28 DIAGNOSIS — Z79899 Other long term (current) drug therapy: Secondary | ICD-10-CM | POA: Insufficient documentation

## 2017-12-28 DIAGNOSIS — E039 Hypothyroidism, unspecified: Secondary | ICD-10-CM | POA: Diagnosis not present

## 2017-12-28 DIAGNOSIS — I10 Essential (primary) hypertension: Secondary | ICD-10-CM | POA: Insufficient documentation

## 2017-12-28 DIAGNOSIS — Z794 Long term (current) use of insulin: Secondary | ICD-10-CM | POA: Insufficient documentation

## 2017-12-28 DIAGNOSIS — Z7982 Long term (current) use of aspirin: Secondary | ICD-10-CM | POA: Diagnosis not present

## 2017-12-28 DIAGNOSIS — E119 Type 2 diabetes mellitus without complications: Secondary | ICD-10-CM | POA: Insufficient documentation

## 2017-12-28 DIAGNOSIS — Z419 Encounter for procedure for purposes other than remedying health state, unspecified: Secondary | ICD-10-CM

## 2017-12-28 DIAGNOSIS — F419 Anxiety disorder, unspecified: Secondary | ICD-10-CM | POA: Insufficient documentation

## 2017-12-28 DIAGNOSIS — Z79890 Hormone replacement therapy (postmenopausal): Secondary | ICD-10-CM | POA: Diagnosis not present

## 2017-12-28 DIAGNOSIS — M5126 Other intervertebral disc displacement, lumbar region: Secondary | ICD-10-CM | POA: Diagnosis present

## 2017-12-28 HISTORY — PX: LUMBAR LAMINECTOMY/DECOMPRESSION MICRODISCECTOMY: SHX5026

## 2017-12-28 LAB — GLUCOSE, CAPILLARY
GLUCOSE-CAPILLARY: 140 mg/dL — AB (ref 70–99)
GLUCOSE-CAPILLARY: 146 mg/dL — AB (ref 70–99)
GLUCOSE-CAPILLARY: 342 mg/dL — AB (ref 70–99)
Glucose-Capillary: 150 mg/dL — ABNORMAL HIGH (ref 70–99)
Glucose-Capillary: 152 mg/dL — ABNORMAL HIGH (ref 70–99)

## 2017-12-28 LAB — HEMOGLOBIN A1C
HEMOGLOBIN A1C: 6.7 % — AB (ref 4.8–5.6)
Mean Plasma Glucose: 145.59 mg/dL

## 2017-12-28 SURGERY — LUMBAR LAMINECTOMY/DECOMPRESSION MICRODISCECTOMY 3 LEVELS
Anesthesia: General | Site: Back

## 2017-12-28 MED ORDER — BACITRACIN ZINC 500 UNIT/GM EX OINT
TOPICAL_OINTMENT | CUTANEOUS | Status: AC
  Filled 2017-12-28: qty 28.35

## 2017-12-28 MED ORDER — ONDANSETRON HCL 4 MG/2ML IJ SOLN
INTRAMUSCULAR | Status: AC
  Filled 2017-12-28: qty 6

## 2017-12-28 MED ORDER — CYCLOSPORINE 0.05 % OP EMUL: 1.0000 [drp] | Freq: Two times a day (BID) | OPHTHALMIC | Status: DC

## 2017-12-28 MED ORDER — ONDANSETRON HCL 4 MG/2ML IJ SOLN: 4.0000 mg | Freq: Four times a day (QID) | INTRAMUSCULAR | Status: DC | PRN

## 2017-12-28 MED ORDER — THROMBIN 5000 UNITS EX SOLR
CUTANEOUS | Status: AC
  Filled 2017-12-28: qty 10000

## 2017-12-28 MED ORDER — PANTOPRAZOLE SODIUM 40 MG PO TBEC: 40.0000 mg | DELAYED_RELEASE_TABLET | ORAL | Status: DC

## 2017-12-28 MED ORDER — DEXAMETHASONE SODIUM PHOSPHATE 10 MG/ML IJ SOLN
INTRAMUSCULAR | Status: AC
  Filled 2017-12-28: qty 1

## 2017-12-28 MED ORDER — SODIUM CHLORIDE 0.9 % IV SOLN: 250.0000 mL | INTRAVENOUS | Status: DC

## 2017-12-28 MED ORDER — MENTHOL 3 MG MT LOZG: 1.0000 | LOZENGE | OROMUCOSAL | Status: DC | PRN

## 2017-12-28 MED ORDER — CHLORHEXIDINE GLUCONATE CLOTH 2 % EX PADS: 6.0000 | MEDICATED_PAD | Freq: Once | CUTANEOUS | Status: DC

## 2017-12-28 MED ORDER — SODIUM CHLORIDE 0.9 % IV SOLN
INTRAVENOUS | Status: DC | PRN
  Administered 2017-12-28: 17:00:00

## 2017-12-28 MED ORDER — SODIUM CHLORIDE 0.9% FLUSH: 3.0000 mL | Freq: Two times a day (BID) | INTRAVENOUS | Status: DC

## 2017-12-28 MED ORDER — NIACIN 500 MG PO TABS: 500.0000 mg | ORAL_TABLET | Freq: Every day | ORAL | Status: DC

## 2017-12-28 MED ORDER — METFORMIN HCL ER 500 MG PO TB24: 500.0000 mg | ORAL_TABLET | Freq: Every day | ORAL | Status: DC

## 2017-12-28 MED ORDER — SODIUM CHLORIDE 0.9% FLUSH: 3.0000 mL | INTRAVENOUS | Status: DC | PRN

## 2017-12-28 MED ORDER — INSULIN ASPART 100 UNIT/ML ~~LOC~~ SOLN
0.0000 [IU] | Freq: Three times a day (TID) | SUBCUTANEOUS | Status: DC
  Administered 2017-12-29: 11 [IU] via SUBCUTANEOUS

## 2017-12-28 MED ORDER — LIDOCAINE 2% (20 MG/ML) 5 ML SYRINGE
INTRAMUSCULAR | Status: DC | PRN
  Administered 2017-12-28: 100 mg via INTRAVENOUS

## 2017-12-28 MED ORDER — HYDROMORPHONE HCL 1 MG/ML IJ SOLN: 0.2500 mg | INTRAMUSCULAR | Status: DC | PRN

## 2017-12-28 MED ORDER — THROMBIN 20000 UNITS EX SOLR
CUTANEOUS | Status: DC | PRN
  Administered 2017-12-28: 17:00:00 via TOPICAL

## 2017-12-28 MED ORDER — INSULIN ASPART 100 UNIT/ML ~~LOC~~ SOLN: 0.0000 [IU] | SUBCUTANEOUS | Status: DC

## 2017-12-28 MED ORDER — LACTATED RINGERS IV SOLN
INTRAVENOUS | Status: DC
  Administered 2017-12-28 (×2): via INTRAVENOUS

## 2017-12-28 MED ORDER — FENTANYL CITRATE (PF) 250 MCG/5ML IJ SOLN
INTRAMUSCULAR | Status: AC
  Filled 2017-12-28: qty 5

## 2017-12-28 MED ORDER — HYDROMORPHONE HCL 1 MG/ML IJ SOLN: 1.0000 mg | INTRAMUSCULAR | Status: DC | PRN

## 2017-12-28 MED ORDER — BACITRACIN ZINC 500 UNIT/GM EX OINT
TOPICAL_OINTMENT | CUTANEOUS | Status: DC | PRN
  Administered 2017-12-28: 1 via TOPICAL

## 2017-12-28 MED ORDER — ROCURONIUM BROMIDE 100 MG/10ML IV SOLN
INTRAVENOUS | Status: DC | PRN
  Administered 2017-12-28: 50 mg via INTRAVENOUS

## 2017-12-28 MED ORDER — LEVOTHYROXINE SODIUM 100 MCG PO TABS
100.0000 ug | ORAL_TABLET | Freq: Every day | ORAL | Status: DC
  Administered 2017-12-29: 100 ug via ORAL
  Filled 2017-12-28: qty 1

## 2017-12-28 MED ORDER — CYCLOBENZAPRINE HCL 10 MG PO TABS
10.0000 mg | ORAL_TABLET | Freq: Three times a day (TID) | ORAL | Status: DC | PRN
  Administered 2017-12-28 – 2017-12-29 (×3): 10 mg via ORAL
  Filled 2017-12-28 (×2): qty 1

## 2017-12-28 MED ORDER — CEFAZOLIN SODIUM-DEXTROSE 2-4 GM/100ML-% IV SOLN
2.0000 g | INTRAVENOUS | Status: AC
  Administered 2017-12-28: 2 g via INTRAVENOUS
  Filled 2017-12-28: qty 100

## 2017-12-28 MED ORDER — INSULIN ASPART 100 UNIT/ML ~~LOC~~ SOLN
0.0000 [IU] | Freq: Every day | SUBCUTANEOUS | Status: DC
  Administered 2017-12-28: 4 [IU] via SUBCUTANEOUS

## 2017-12-28 MED ORDER — THROMBIN (RECOMBINANT) 20000 UNITS EX SOLR
CUTANEOUS | Status: AC
  Filled 2017-12-28: qty 20000

## 2017-12-28 MED ORDER — SERTRALINE HCL 50 MG PO TABS
100.0000 mg | ORAL_TABLET | Freq: Every day | ORAL | Status: DC
  Administered 2017-12-28: 100 mg via ORAL
  Filled 2017-12-28: qty 2

## 2017-12-28 MED ORDER — AMLODIPINE BESYLATE 5 MG PO TABS: 5.0000 mg | ORAL_TABLET | Freq: Every day | ORAL | Status: DC

## 2017-12-28 MED ORDER — CARVEDILOL 25 MG PO TABS
25.0000 mg | ORAL_TABLET | Freq: Two times a day (BID) | ORAL | Status: DC
  Administered 2017-12-28: 25 mg via ORAL
  Filled 2017-12-28: qty 1

## 2017-12-28 MED ORDER — CYCLOBENZAPRINE HCL 10 MG PO TABS
ORAL_TABLET | ORAL | Status: AC
  Administered 2017-12-28: 10 mg via ORAL
  Filled 2017-12-28: qty 1

## 2017-12-28 MED ORDER — DEXAMETHASONE SODIUM PHOSPHATE 10 MG/ML IJ SOLN
INTRAMUSCULAR | Status: DC | PRN
  Administered 2017-12-28: 10 mg via INTRAVENOUS

## 2017-12-28 MED ORDER — METFORMIN HCL ER 500 MG PO TB24: 500.0000 mg | ORAL_TABLET | ORAL | Status: DC

## 2017-12-28 MED ORDER — 0.9 % SODIUM CHLORIDE (POUR BTL) OPTIME
TOPICAL | Status: DC | PRN
  Administered 2017-12-28: 1000 mL

## 2017-12-28 MED ORDER — FENTANYL CITRATE (PF) 100 MCG/2ML IJ SOLN
INTRAMUSCULAR | Status: DC | PRN
  Administered 2017-12-28: 100 ug via INTRAVENOUS
  Administered 2017-12-28: 25 ug via INTRAVENOUS

## 2017-12-28 MED ORDER — ACETAMINOPHEN 325 MG PO TABS: 650.0000 mg | ORAL_TABLET | ORAL | Status: DC | PRN

## 2017-12-28 MED ORDER — ATORVASTATIN CALCIUM 20 MG PO TABS
80.0000 mg | ORAL_TABLET | Freq: Every day | ORAL | Status: DC
  Administered 2017-12-28: 80 mg via ORAL
  Filled 2017-12-28: qty 4

## 2017-12-28 MED ORDER — ACETAMINOPHEN 650 MG RE SUPP: 650.0000 mg | RECTAL | Status: DC | PRN

## 2017-12-28 MED ORDER — BUPIVACAINE-EPINEPHRINE 0.5% -1:200000 IJ SOLN
INTRAMUSCULAR | Status: AC
  Filled 2017-12-28: qty 1

## 2017-12-28 MED ORDER — SUGAMMADEX SODIUM 200 MG/2ML IV SOLN
INTRAVENOUS | Status: DC | PRN
  Administered 2017-12-28: 200 mg via INTRAVENOUS

## 2017-12-28 MED ORDER — PHENOL 1.4 % MT LIQD: 1.0000 | OROMUCOSAL | Status: DC | PRN

## 2017-12-28 MED ORDER — THROMBIN 5000 UNITS EX SOLR
OROMUCOSAL | Status: DC | PRN
  Administered 2017-12-28: 17:00:00 via TOPICAL

## 2017-12-28 MED ORDER — ONDANSETRON HCL 4 MG/2ML IJ SOLN
INTRAMUSCULAR | Status: DC | PRN
  Administered 2017-12-28: 4 mg via INTRAVENOUS

## 2017-12-28 MED ORDER — BUPIVACAINE-EPINEPHRINE (PF) 0.5% -1:200000 IJ SOLN
INTRAMUSCULAR | Status: DC | PRN
  Administered 2017-12-28: 10 mL

## 2017-12-28 MED ORDER — IRBESARTAN 300 MG PO TABS
300.0000 mg | ORAL_TABLET | Freq: Every day | ORAL | Status: DC
  Filled 2017-12-28 (×2): qty 1

## 2017-12-28 MED ORDER — PROPOFOL 10 MG/ML IV BOLUS
INTRAVENOUS | Status: DC | PRN
  Administered 2017-12-28: 100 mg via INTRAVENOUS

## 2017-12-28 MED ORDER — GLIMEPIRIDE 2 MG PO TABS
2.0000 mg | ORAL_TABLET | Freq: Every day | ORAL | Status: DC
  Administered 2017-12-29: 2 mg via ORAL
  Filled 2017-12-28: qty 1

## 2017-12-28 MED ORDER — HYDROMORPHONE HCL 2 MG PO TABS
ORAL_TABLET | ORAL | Status: AC
  Administered 2017-12-28: 2 mg via ORAL
  Filled 2017-12-28: qty 1

## 2017-12-28 MED ORDER — PHENYLEPHRINE HCL 10 MG/ML IJ SOLN
INTRAMUSCULAR | Status: DC | PRN
  Administered 2017-12-28 (×3): 80 ug via INTRAVENOUS
  Administered 2017-12-28: 160 ug via INTRAVENOUS

## 2017-12-28 MED ORDER — LIDOCAINE 2% (20 MG/ML) 5 ML SYRINGE
INTRAMUSCULAR | Status: AC
  Filled 2017-12-28: qty 10

## 2017-12-28 MED ORDER — DOCUSATE SODIUM 100 MG PO CAPS
100.0000 mg | ORAL_CAPSULE | Freq: Two times a day (BID) | ORAL | Status: DC
  Administered 2017-12-28: 100 mg via ORAL
  Filled 2017-12-28: qty 1

## 2017-12-28 MED ORDER — LORATADINE 10 MG PO TABS: 10.0000 mg | ORAL_TABLET | Freq: Every day | ORAL | Status: DC | PRN

## 2017-12-28 MED ORDER — CEFAZOLIN SODIUM-DEXTROSE 2-4 GM/100ML-% IV SOLN
2.0000 g | Freq: Three times a day (TID) | INTRAVENOUS | Status: AC
  Administered 2017-12-29 (×2): 2 g via INTRAVENOUS
  Filled 2017-12-28 (×2): qty 100

## 2017-12-28 MED ORDER — METFORMIN HCL ER 500 MG PO TB24
1000.0000 mg | ORAL_TABLET | Freq: Every day | ORAL | Status: DC
  Administered 2017-12-29: 1000 mg via ORAL
  Filled 2017-12-28: qty 2

## 2017-12-28 MED ORDER — BISACODYL 10 MG RE SUPP: 10.0000 mg | Freq: Every day | RECTAL | Status: DC | PRN

## 2017-12-28 MED ORDER — ACETAMINOPHEN 500 MG PO TABS
1000.0000 mg | ORAL_TABLET | Freq: Four times a day (QID) | ORAL | Status: DC
  Administered 2017-12-28 – 2017-12-29 (×3): 1000 mg via ORAL
  Filled 2017-12-28 (×3): qty 2

## 2017-12-28 MED ORDER — HYDROMORPHONE HCL 2 MG PO TABS
2.0000 mg | ORAL_TABLET | ORAL | Status: DC | PRN
  Administered 2017-12-28: 2 mg via ORAL
  Filled 2017-12-28: qty 1

## 2017-12-28 MED ORDER — EXENATIDE 10 MCG/0.04ML ~~LOC~~ SOPN: 10.0000 ug | PEN_INJECTOR | Freq: Two times a day (BID) | SUBCUTANEOUS | Status: DC

## 2017-12-28 MED ORDER — ONDANSETRON HCL 4 MG PO TABS: 4.0000 mg | ORAL_TABLET | Freq: Four times a day (QID) | ORAL | Status: DC | PRN

## 2017-12-28 MED ORDER — POTASSIUM CHLORIDE CRYS ER 20 MEQ PO TBCR: 20.0000 meq | EXTENDED_RELEASE_TABLET | Freq: Two times a day (BID) | ORAL | Status: DC

## 2017-12-28 MED ORDER — SODIUM CHLORIDE 0.9 % IV SOLN
INTRAVENOUS | Status: DC | PRN
  Administered 2017-12-28: 30 ug/min via INTRAVENOUS

## 2017-12-28 SURGICAL SUPPLY — 56 items
APL SKNCLS STERI-STRIP NONHPOA (GAUZE/BANDAGES/DRESSINGS) ×1
BAG DECANTER FOR FLEXI CONT (MISCELLANEOUS) ×2 IMPLANT
BENZOIN TINCTURE PRP APPL 2/3 (GAUZE/BANDAGES/DRESSINGS) ×2 IMPLANT
BLADE CLIPPER SURG (BLADE) IMPLANT
BUR MATCHSTICK NEURO 3.0 LAGG (BURR) ×2 IMPLANT
BUR PRECISION FLUTE 6.0 (BURR) ×2 IMPLANT
CANISTER SUCT 3000ML PPV (MISCELLANEOUS) ×2 IMPLANT
CARTRIDGE OIL MAESTRO DRILL (MISCELLANEOUS) ×1 IMPLANT
COVER WAND RF STERILE (DRAPES) ×1 IMPLANT
DIFFUSER DRILL AIR PNEUMATIC (MISCELLANEOUS) ×2 IMPLANT
DRAPE LAPAROTOMY 100X72X124 (DRAPES) ×2 IMPLANT
DRAPE MICROSCOPE LEICA (MISCELLANEOUS) ×2 IMPLANT
DRAPE POUCH INSTRU U-SHP 10X18 (DRAPES) ×1 IMPLANT
DRAPE SURG 17X23 STRL (DRAPES) ×8 IMPLANT
DRSG OPSITE POSTOP 4X6 (GAUZE/BANDAGES/DRESSINGS) ×1 IMPLANT
ELECT BLADE 4.0 EZ CLEAN MEGAD (MISCELLANEOUS) ×2
ELECT REM PT RETURN 9FT ADLT (ELECTROSURGICAL) ×2
ELECTRODE BLDE 4.0 EZ CLN MEGD (MISCELLANEOUS) ×1 IMPLANT
ELECTRODE REM PT RTRN 9FT ADLT (ELECTROSURGICAL) ×1 IMPLANT
GAUZE 4X4 16PLY RFD (DISPOSABLE) IMPLANT
GAUZE SPONGE 4X4 12PLY STRL (GAUZE/BANDAGES/DRESSINGS) ×1 IMPLANT
GLOVE BIO SURGEON STRL SZ 6.5 (GLOVE) ×4 IMPLANT
GLOVE BIO SURGEON STRL SZ8 (GLOVE) ×2 IMPLANT
GLOVE BIO SURGEON STRL SZ8.5 (GLOVE) ×2 IMPLANT
GLOVE BIOGEL PI IND STRL 6.5 (GLOVE) IMPLANT
GLOVE BIOGEL PI IND STRL 7.5 (GLOVE) IMPLANT
GLOVE BIOGEL PI INDICATOR 6.5 (GLOVE) ×2
GLOVE BIOGEL PI INDICATOR 7.5 (GLOVE) ×1
GLOVE EXAM NITRILE XL STR (GLOVE) IMPLANT
GLOVE SURG SS PI 7.0 STRL IVOR (GLOVE) ×1 IMPLANT
GOWN STRL REUS W/ TWL LRG LVL3 (GOWN DISPOSABLE) IMPLANT
GOWN STRL REUS W/ TWL XL LVL3 (GOWN DISPOSABLE) ×1 IMPLANT
GOWN STRL REUS W/TWL 2XL LVL3 (GOWN DISPOSABLE) IMPLANT
GOWN STRL REUS W/TWL LRG LVL3 (GOWN DISPOSABLE) ×6
GOWN STRL REUS W/TWL XL LVL3 (GOWN DISPOSABLE) ×2
HEMOSTAT POWDER SURGIFOAM 1G (HEMOSTASIS) ×1 IMPLANT
KIT BASIN OR (CUSTOM PROCEDURE TRAY) ×2 IMPLANT
KIT TURNOVER KIT B (KITS) ×2 IMPLANT
NDL HYPO 21X1.5 SAFETY (NEEDLE) IMPLANT
NEEDLE HYPO 21X1.5 SAFETY (NEEDLE) IMPLANT
NEEDLE HYPO 22GX1.5 SAFETY (NEEDLE) ×2 IMPLANT
NS IRRIG 1000ML POUR BTL (IV SOLUTION) ×2 IMPLANT
OIL CARTRIDGE MAESTRO DRILL (MISCELLANEOUS) ×2
PACK LAMINECTOMY NEURO (CUSTOM PROCEDURE TRAY) ×2 IMPLANT
PAD ARMBOARD 7.5X6 YLW CONV (MISCELLANEOUS) ×6 IMPLANT
PATTIES SURGICAL .5 X1 (DISPOSABLE) IMPLANT
RUBBERBAND STERILE (MISCELLANEOUS) ×4 IMPLANT
SPONGE SURGIFOAM ABS GEL 100 (HEMOSTASIS) ×1 IMPLANT
SPONGE SURGIFOAM ABS GEL SZ50 (HEMOSTASIS) ×1 IMPLANT
STRIP CLOSURE SKIN 1/2X4 (GAUZE/BANDAGES/DRESSINGS) ×2 IMPLANT
SUT VIC AB 1 CT1 18XBRD ANBCTR (SUTURE) ×2 IMPLANT
SUT VIC AB 1 CT1 8-18 (SUTURE) ×4
SUT VIC AB 2-0 CP2 18 (SUTURE) ×4 IMPLANT
TOWEL GREEN STERILE (TOWEL DISPOSABLE) ×2 IMPLANT
TOWEL GREEN STERILE FF (TOWEL DISPOSABLE) ×2 IMPLANT
WATER STERILE IRR 1000ML POUR (IV SOLUTION) ×2 IMPLANT

## 2017-12-28 NOTE — Op Note (Signed)
Brief history: The patient is a 72 year old white male whose had several previous back surgeries by another physician.  He has developed recurrent back and right leg pain consistent with a lumbar radiculopathy.  He failed medical management.  He was worked up with a lumbar MRI which demonstrated spinal stenosis on the right at L2-3 and L3-4 and a herniated disc at L4-5 on the right.  I discussed the various treatment options.  He has decided to proceed with surgery after weighing the risks, benefits and alternatives.  Preoperative diagnosis: Right L2-3 and L3-4 spinal stenosis, right L4-5 herniated disc, lumbago, lumbar radiculopathy  Postoperative diagnosis: The same  Procedure: L2-3 and L3-4 laminotomy/foraminotomies, right L4-5 intervertebral discectomy using micro-dissection  Surgeon: Dr. Earle Gell  Asst.: Arnetha Massy nurse practitioner  Anesthesia: Gen. endotracheal  Estimated blood loss: 5 cc  Drains: None  Complications: None  Description of procedure: The patient was brought to the operating room by the anesthesia team. General endotracheal anesthesia was induced. The patient was turned to the prone position on the Wilson frame. The patient's lumbosacral region was then prepared with Betadine scrub and Betadine solution. Sterile drapes were applied.  I then injected the area to be incised with Marcaine with epinephrine solution. I then used a scalpel to make a linear midline incision over the L2-3, L3-4 and L4-5 intervertebral disc space. I then used electrocautery to perform a right sided subperiosteal dissection exposing the spinous process and lamina of L2, L3, L4 and L5. We obtained intraoperative radiograph to confirm our location. I then inserted the [facility] retractor for exposure.  We then brought the operative microscope into the field. Under its magnification and illumination we completed the microdissection. I used a high-speed drill to perform a laminotomy at L2-3,  L3-4 and L4-5 on the right. I then used a Kerrison punches to widen the laminotomy and removed the scar tissue and remainder of the ligamentum flavum at 2 3, L3-4 and L4-5 on the right. We then used microdissection to free up the thecal sac and the right L3, L4 and L5 nerve root from the epidural tissue. I then used a Kerrison punch to perform a foraminotomy at about the right L3, L4 and L5 nerve root. We then using the nerve root retractor to gently retract the thecal sac and the L5 nerve root medially. This exposed the herniated disc at L4-5 on the right. We identified the ruptured disc and remove it with the pituitary forceps.  We did not perform an intervertebral discectomy.  I then palpated along the ventral surface of the thecal sac and along exit route of the right L3, L4 and L5 nerve root and noted that the neural structures were well decompressed. This completed the decompression.  We then obtained hemostasis using bipolar electrocautery. We irrigated the wound out with bacitracin solution. We then removed the retractor. We then reapproximated the patient's thoracolumbar fascia with interrupted #1 Vicryl suture. We then reapproximated the patient's subcutaneous tissue with interrupted 2-0 Vicryl suture. We then reapproximated patient's skin with Steri-Strips and benzoin. The was then coated with bacitracin ointment. The drapes were removed. The patient was subsequently returned to the supine position where they were extubated by the anesthesia team. The patient was then transported to the postanesthesia care unit in stable condition. All sponge instrument and needle counts were reportedly correct at the end of this case.

## 2017-12-28 NOTE — Anesthesia Postprocedure Evaluation (Signed)
Anesthesia Post Note  Patient: Kawhi Die[facility]  Procedure(s) Performed: LAMINECTOMY AND FORAMINOTOMY LUMBAR TWO- LUMBAR THREE, LUMBAR THREE- LUMBAR FOUR, LUMBAR FOUR- LUMBAR FIVE, RIGHT LUMBAR FOUR- LUMBAR FIVE DISKECTOMY (N/A Back)     Patient location during evaluation: PACU Anesthesia Type: General Level of consciousness: awake and alert Pain management: pain level controlled Vital Signs Assessment: post-procedure vital signs reviewed and stable Respiratory status: spontaneous breathing, nonlabored ventilation and respiratory function stable Cardiovascular status: blood pressure returned to baseline and stable Postop Assessment: no apparent nausea or vomiting Anesthetic complications: no    Last Vitals:  Vitals:   12/28/17 1830 12/28/17 1925  BP: (!) 153/75 (!) 157/71  Pulse: 79   Resp: 12   Temp:    SpO2: 100%     Last Pain:  Vitals:   12/28/17 1925  TempSrc:   PainSc: 2                  Audry Pili

## 2017-12-28 NOTE — Anesthesia Preprocedure Evaluation (Addendum)
Anesthesia Evaluation  Patient identified by MRN, date of birth, ID band Patient awake    Reviewed: Allergy & Precautions, NPO status , Patient's Chart, lab work & pertinent test results, reviewed documented beta blocker date and time   Airway Mallampati: II  TM Distance: >3 FB Neck ROM: Full    Dental no notable dental hx. (+) Teeth Intact, Dental Advisory Given   Pulmonary neg pulmonary ROS,    Pulmonary exam normal breath sounds clear to auscultation       Cardiovascular hypertension, Pt. on medications and Pt. on home beta blockers negative cardio ROS Normal cardiovascular exam Rhythm:Regular Rate:Normal     Neuro/Psych PSYCHIATRIC DISORDERS Anxiety negative neurological ROS     GI/Hepatic negative GI ROS, Neg liver ROS,   Endo/Other  diabetes, Type 2, Insulin Dependent, Oral Hypoglycemic AgentsHypothyroidism   Renal/GU negative Renal ROS  negative genitourinary   Musculoskeletal negative musculoskeletal ROS (+)   Abdominal   Peds  Hematology negative hematology ROS (+)   Anesthesia Other Findings   Reproductive/Obstetrics                            Anesthesia Physical Anesthesia Plan  ASA: III  Anesthesia Plan: General   Post-op Pain Management:    Induction: Intravenous  PONV Risk Score and Plan: 2 and Ondansetron and Treatment may vary due to age or medical condition  Airway Management Planned: Oral ETT and Video Laryngoscope Planned  Additional Equipment:   Intra-op Plan:   Post-operative Plan: Extubation in OR  Informed Consent: I have reviewed the patients History and Physical, chart, labs and discussed the procedure including the risks, benefits and alternatives for the proposed anesthesia with the patient or authorized representative who has indicated his/her understanding and acceptance.   Dental advisory given  Plan Discussed with: CRNA  Anesthesia Plan  Comments:         Anesthesia Quick Evaluation

## 2017-12-28 NOTE — H&P (Signed)
Subjective: The patient is a 72 year old white male whose had previous back surgery.  He has developed recurrent back and right leg pain consistent with a lumbar radiculopathy.  He was worked up with a lumbar MRI which demonstrated spinal stenosis and herniated disks at L2-3, L3-4 and L4-5 on the right.  I discussed the various treatment options.  He has decided to proceed with surgery.  Past Medical History:  Diagnosis Date  . Anxiety   . Cancer ([place])   . Diabetes mellitus without complication ([place])   . Hypertension   . Hypothyroidism     Past Surgical History:  Procedure Laterality Date  . BACK SURGERY     x 5- first surgery 2006, 2008, 2009 x 2 and 2011  . EYE SURGERY  2016   cataract surgery  . FRACTURE SURGERY  1991  . HERNIA REPAIR  1992    Allergies  Allergen Reactions  . Codeine Nausea Only  . Morphine And Related Other (See Comments)    Alters heart rate    Social History   Tobacco Use  . Smoking status: Never Smoker  . Smokeless tobacco: Never Used  Substance Use Topics  . Alcohol use: Never    Frequency: Never    History reviewed. No pertinent family history. Prior to Admission medications   Medication Sig Start Date End Date Taking? Authorizing Provider  acetaminophen (TYLENOL) 500 MG tablet Take 500 mg by mouth every 6 (six) hours as needed for moderate pain.   Yes [provider]  amLODipine (NORVASC) 5 MG tablet Take 5 mg by mouth daily.   Yes [provider]  aspirin EC 81 MG tablet Take 81 mg by mouth daily.   Yes [provider]  atorvastatin (LIPITOR) 80 MG tablet Take 80 mg by mouth daily.   Yes [provider]  carvedilol (COREG) 25 MG tablet Take 25 mg by mouth 2 (two) times daily. 10/25/17  Yes [provider]  Cholecalciferol (VITAMIN D3) 2000 units TABS Take 2,000 Units by mouth daily.   Yes [provider]  Cyanocobalamin (VITAMIN B-12 IJ) Inject 1,000 mcg as directed every 30 (thirty) days.    Yes [provider]  exenatide (BYETTA) 10 MCG/0.04ML SOPN injection Inject 10 mcg into the skin 2 (two) times daily with a meal.   Yes [provider]  glimepiride (AMARYL) 4 MG tablet Take 2 mg by mouth daily with breakfast.   Yes [provider]  irbesartan (AVAPRO) 300 MG tablet Take 300 mg by mouth daily. 10/25/17  Yes [provider]  lactobacillus acidophilus (BACID) TABS tablet Take 1 tablet by mouth daily.   Yes [provider]  LANTUS SOLOSTAR 100 UNIT/ML Solostar Pen Inject 40 Units into the skin at bedtime. 12/15/17  Yes [provider]  levothyroxine (SYNTHROID, LEVOTHROID) 100 MCG tablet Take 100 mcg by mouth daily before breakfast. 12/04/15  Yes [provider]  loratadine (CLARITIN) 10 MG tablet Take 10 mg by mouth daily as needed for allergies.   Yes [provider]  metFORMIN (GLUCOPHAGE-XR) 500 MG 24 hr tablet Take 500-1,000 mg by mouth See admin instructions. Take 1000 mg by mouth in the morning and 500 mg in the evening 10/15/17  Yes [provider]  niacin 500 MG tablet Take 500 mg by mouth at bedtime.   Yes [provider]  Omega-3 Fatty Acids (FISH OIL) 1200 MG CAPS Take 3,600 mg by mouth daily.    Yes [provider]  omeprazole (  PRILOSEC) 20 MG capsule Take 20 mg by mouth every other day.   Yes [provider]  potassium chloride SA (K-DUR,KLOR-CON) 20 MEQ tablet Take 20 mEq by mouth 2 (two) times daily.   Yes [provider]  sertraline (ZOLOFT) 100 MG tablet Take 100 mg by mouth at bedtime.   Yes [provider]  cycloSPORINE (RESTASIS) 0.05 % ophthalmic emulsion Place 1 drop into both eyes 2 (two) times daily.    [provider]  HYDROcodone-acetaminophen (NORCO/VICODIN) 5-325 MG tablet Take 1 tablet by mouth every 6 (six) hours as needed. Patient not taking: Reported on 12/21/2017 09/12/17   Suella Broad A, PA-C  methocarbamol (ROBAXIN) 500  MG tablet Take 1 tablet (500 mg total) by mouth 2 (two) times daily. Patient not taking: Reported on 12/21/2017 09/12/17   Tacy Learn, PA-C     Review of Systems  Positive ROS: As above  All other systems have been reviewed and were otherwise negative with the exception of those mentioned in the HPI and as above.  Objective: Vital signs in last 24 hours: Temp:  [98.1 F (36.7 C)] 98.1 F (36.7 C) (11/06 1049) Pulse Rate:  [71] 71 (11/06 1049) Resp:  [18] 18 (11/06 1049) BP: (153)/(59) 153/59 (11/06 1049) SpO2:  [97 %] 97 % (11/06 1049) Weight:  [89.8 kg] 89.8 kg (11/06 1049) Estimated body mass index is 30.11 kg/m as calculated from the following:   Height as of this encounter: 5\' 8"  (1.727 m).   Weight as of this encounter: 89.8 kg.   General Appearance: Alert Head: Normocephalic, without obvious abnormality, atraumatic Eyes: PERRL, conjunctiva/corneas clear, EOM's intact,    Ears: Normal  Throat: Normal  Neck: Supple, Back: The patient's lumbar incision is well-healed. Lungs: Clear to auscultation bilaterally, respirations unlabored Heart: Regular rate and rhythm, no murmur, rub or gallop Abdomen: Soft, non-tender Extremities: Extremities normal, atraumatic, no cyanosis or edema Skin: unremarkable  NEUROLOGIC:   Mental status: alert and oriented,Motor Exam - grossly normal Sensory Exam - grossly normal Reflexes:  Coordination - grossly normal Gait - grossly normal Balance - grossly normal Cranial Nerves: I: smell Not tested  II: visual acuity  OS: Normal  OD: Normal   II: visual fields Full to confrontation  II: pupils Equal, round, reactive to light  III,VII: ptosis None  III,IV,VI: extraocular muscles  Full ROM  V: mastication Normal  V: facial light touch sensation  Normal  V,VII: corneal reflex  Present  VII: facial muscle function - upper  Normal  VII: facial muscle function - lower Normal  VIII: hearing Not tested  IX: soft palate elevation   Normal  IX,X: gag reflex Present  XI: trapezius strength  5/5  XI: sternocleidomastoid strength 5/5  XI: neck flexion strength  5/5  XII: tongue strength  Normal    Data Review Lab Results  Component Value Date   WBC 6.2 12/23/2017   HGB 13.9 12/23/2017   HCT 42.3 12/23/2017   MCV 89.1 12/23/2017   PLT 194 12/23/2017   Lab Results  Component Value Date   NA 141 12/23/2017   K 4.1 12/23/2017   CL 108 12/23/2017   CO2 28 12/23/2017   BUN 16 12/23/2017   CREATININE 1.13 12/23/2017   GLUCOSE 72 12/23/2017   Lab Results  Component Value Date   INR 0.97 10/15/2009    Assessment/Plan: Right L2-3 and L3-4 stenosis, right L4-5 herniated disc, lumbago, lumbar radiculopathy: I have discussed the situation with the patient.  I reviewed his imaging studies with him and pointed out the abnormalities.  We have discussed the various treatment options including surgery.  I have described a surgically treatment option of a right L2-3 and L3-4 laminotomy/foraminotomy and a right L4-5 discectomy.  I have shown him surgical models.  I have given him a surgical pamphlet.  We have discussed the risks, benefits, alternatives, expected postoperative course, and likelihood of achieving our goals with surgery.  I have answered all his questions.  He has decided to proceed with surgery.   Ophelia Charter 12/28/2017 3:43 PM

## 2017-12-28 NOTE — Transfer of Care (Signed)
Immediate Anesthesia Transfer of Care Note  Patient: [name] [facility]  Procedure(s) Performed: LAMINECTOMY AND FORAMINOTOMY LUMBAR TWO- LUMBAR THREE, LUMBAR THREE- LUMBAR FOUR, LUMBAR FOUR- LUMBAR FIVE, RIGHT LUMBAR FOUR- LUMBAR FIVE DISKECTOMY (N/A Back)  Patient Location: PACU  Anesthesia Type:General  Level of Consciousness: awake, alert , oriented and patient cooperative  Airway & Oxygen Therapy: Patient Spontanous Breathing and Patient connected to nasal cannula oxygen  Post-op Assessment: Report given to RN, Post -op Vital signs reviewed and stable and Patient moving all extremities  Post vital signs: Reviewed and stable  Last Vitals:  Vitals Value Taken Time  BP 141/99 12/28/2017  6:15 PM  Temp    Pulse 69 12/28/2017  6:17 PM  Resp 15 12/28/2017  6:17 PM  SpO2 100 % 12/28/2017  6:17 PM  Vitals shown include unvalidated device data.  Last Pain:  Vitals:   12/28/17 1131  TempSrc:   PainSc: 2          Complications: No apparent anesthesia complications

## 2017-12-28 NOTE — Anesthesia Procedure Notes (Signed)
Procedure Name: Intubation Date/Time: 12/28/2017 4:11 PM Performed by: Myna Bright, CRNA Pre-anesthesia Checklist: Patient identified, Emergency Drugs available, Suction available and Patient being monitored Patient Re-evaluated:Patient Re-evaluated prior to induction Oxygen Delivery Method: Circle system utilized Preoxygenation: Pre-oxygenation with 100% oxygen Induction Type: IV induction Ventilation: Mask ventilation without difficulty Laryngoscope Size: Glidescope and 4 Tube type: Oral Tube size: 7.5 mm Number of attempts: 1 Airway Equipment and Method: Stylet and Video-laryngoscopy Placement Confirmation: ETT inserted through vocal cords under direct vision,  positive ETCO2 and breath sounds checked- equal and bilateral Secured at: 22 cm Tube secured with: Tape Dental Injury: Teeth and Oropharynx as per pre-operative assessment  Comments: Glidescope used d/t history of cervical spine surgery. Grade I view with Glide. Atraumatic intubation, C-spine remained neutral.

## 2017-12-29 ENCOUNTER — Encounter (HOSPITAL_COMMUNITY): Payer: Self-pay | Admitting: Neurosurgery

## 2017-12-29 DIAGNOSIS — M48061 Spinal stenosis, lumbar region without neurogenic claudication: Secondary | ICD-10-CM | POA: Diagnosis not present

## 2017-12-29 LAB — GLUCOSE, CAPILLARY: Glucose-Capillary: 262 mg/dL — ABNORMAL HIGH (ref 70–99)

## 2017-12-29 MED ORDER — CYCLOBENZAPRINE HCL 10 MG PO TABS: 10.0000 mg | ORAL_TABLET | Freq: Three times a day (TID) | ORAL | 0 refills | Status: DC | PRN

## 2017-12-29 MED ORDER — HYDROMORPHONE HCL 2 MG PO TABS: 2.0000 mg | ORAL_TABLET | ORAL | 0 refills | Status: DC | PRN

## 2017-12-29 MED ORDER — DOCUSATE SODIUM 100 MG PO CAPS: 100.0000 mg | ORAL_CAPSULE | Freq: Two times a day (BID) | ORAL | 0 refills | Status: DC

## 2017-12-29 MED FILL — Thrombin (Recombinant) For Soln 20000 Unit: CUTANEOUS | Qty: 1 | Status: AC

## 2017-12-29 NOTE — Discharge Instructions (Signed)
Call MD for: Difficulty breathing, headache or visual disturbances, extreme fatigue  Call MD for: hives  Call MD for: persistant dizziness or light-headedness  Call MD for: persistant nausea and vomiting  Call MD for: redness, tenderness, or signs of infection (pain, swelling, redness, odor or green/yellow discharge around incision site)  Call MD for: severe uncontrolled pain  Call MD for: temperature >100.4 Diet - low sodium heart healthy Discharge instructions   Call [phone] for a followup appointment.   Increase activity slowly Remove dressing in 48 hours      Laminectomy, Care After This sheet gives you information about how to care for yourself after your procedure. Your health care provider may also give you more specific instructions. If you have problems or questions, contact your health care provider. What can I expect after the procedure? After the procedure, it is common to have:  Some pain around your incision area.  Muscle tightening (spasms) across the back.  Follow these instructions at home: Incision care  Follow instructions from your health care provider about how to take care of your incision area. Make sure you: ? Wash your hands with soap and water before and after you apply medicine to the area or change your bandage (dressing). If soap and water are not available, use hand sanitizer. ? Change your dressing as told by your health care provider. ? Leave stitches (sutures), skin glue, or adhesive strips in place. These skin closures may need to stay in place for 2 weeks or longer. If adhesive strip edges start to loosen and curl up, you may trim the loose edges. Do not remove adhesive strips completely unless your health care provider tells you to do that.  Check your incision area every day for signs of infection. Check for: ? More redness, swelling, or pain. ? More fluid or blood. ? Warmth. ? Pus or a bad smell. Medicines  Take over-the-counter and  prescription medicines only as told by your health care provider.  If you were prescribed an antibiotic medicine, use it as told by your health care provider. Do not stop using the antibiotic even if you start to feel better. Bathing  Do not take baths, swim, or use a hot tub for 2 weeks, or until your incision has healed completely.  If your health care provider approves, you may take showers after your dressing has been removed. Activity  Return to your normal activities as told by your health care provider. Ask your health care provider what activities are safe for you.  Avoid bending or twisting at your waist. Always bend at your knees.  Do not sit for more than 20-30 minutes at a time. Lie down or walk between periods of sitting.  Do not lift anything that is heavier than 10 lb (4.5 kg) or the limit that your health care provider tells you, until he or she says that it is safe.  Do not drive for 2 weeks after your procedure or for as long as your health care provider tells you.  Do not drive or use heavy machinery while taking prescription pain medicine. General instructions  To prevent or treat constipation while you are taking prescription pain medicine, your health care provider may recommend that you: ? Drink enough fluid to keep your urine clear or pale yellow. ? Take over-the-counter or prescription medicines. ? Eat foods that are high in fiber, such as fresh fruits and vegetables, whole grains, and beans. ? Limit foods that are high in fat  and processed sugars, such as fried and sweet foods.  Do breathing exercises as told.  Keep all follow-up visits as told by your health care provider. This is important. Contact a health care provider if:  You have more redness, swelling, or pain around your incision area.  Your incision feels warm to the touch.  You are not able to return to activities or do exercises as told by your health care provider. Get help right away  if:  You have: ? More fluid or blood coming from your incision area. ? Pus or a bad smell coming from your incision area. ? Chills or a fever. ? Episodes of dizziness or fainting while standing.  You develop a rash.  You develop shortness of breath or you have difficulty breathing.  You cannot control when you urinate or have a bowel movement.  You become weak.  You are not able to use your legs. Summary  After the procedure, it is common to have some pain around your incision area. You may also have muscle tightening (spasms) across the back.  Follow instructions from your health care provider about how to care for your incision.  Do not lift anything that is heavier than 10 lb (4.5 kg) or the limit that your health care provider tells you, until he or she says that it is safe.  Contact your health care provider if you have more redness, swelling, or pain around your incision area or if your incision feels warm to the touch. These can be signs of infection. This information is not intended to replace advice given to you by your health care provider. Make sure you discuss any questions you have with your health care provider. Document Released: 08/28/2004 Document Revised: 09/25/2015 Document Reviewed: 07/27/2015 Elsevier Interactive Patient Education  2018 [employer].

## 2017-12-29 NOTE — Discharge Summary (Signed)
Physician Discharge Summary  Patient ID: [name] MRN: [id number] DOB/AGE: 1946/02/12 72 y.o.  Admit date: 12/28/2017 Discharge date: 12/29/2017  Admission Diagnoses: Lumbar spinal stenosis, lumbar herniated disc, lumbar radiculopathy, lumbago  Discharge Diagnoses: The same Active Problems:   Lumbar herniated disc   Discharged Condition: good  Hospital Course: I performed a right L2-3 and L3-4 laminotomy/foraminotomies and right L4-5 discectomy on the patient on 12/28/2017.  The surgery went well.  The patient's postoperative course was unremarkable.  On postoperative day #1 he requested discharge to home.  He was given written and oral discharge instructions.  All his questions were answered.  Consults: Physical therapy Significant Diagnostic Studies: None Treatments: Right L2-3 and L3-4 laminotomy/foraminotomy, right L4-5 discectomy using microdissection. Discharge Exam: Blood pressure (!) 171/88, pulse (!) 113, temperature 98.2 F (36.8 C), temperature source Oral, resp. rate 16, height 5\' 8"  (1.727 m), weight 89.8 kg, SpO2 99 %. The patient is alert and pleasant.  His lower extremity strength is normal.  His dressing is clean and dry.  He looks well.  Disposition: Home  Discharge Instructions    Call MD for:  difficulty breathing, headache or visual disturbances   Complete by:  As directed    Call MD for:  extreme fatigue   Complete by:  As directed    Call MD for:  hives   Complete by:  As directed    Call MD for:  persistant dizziness or light-headedness   Complete by:  As directed    Call MD for:  persistant nausea and vomiting   Complete by:  As directed    Call MD for:  redness, tenderness, or signs of infection (pain, swelling, redness, odor or green/yellow discharge around incision site)   Complete by:  As directed    Call MD for:  severe uncontrolled pain   Complete by:  As directed    Call MD for:  temperature >100.4   Complete by:  As directed    Diet - low sodium heart healthy   Complete by:  As directed    Discharge instructions   Complete by:  As directed    Call [phone] for a followup appointment. Take a stool softener while you are using pain medications.   Driving Restrictions   Complete by:  As directed    Do not drive for 2 weeks.   Increase activity slowly   Complete by:  As directed    Lifting restrictions   Complete by:  As directed    Do not lift more than 5 pounds. No excessive bending or twisting.   May shower / Bathe   Complete by:  As directed    Remove the dressing for 3 days after surgery.  You may shower, but leave the incision alone.   Remove dressing in 48 hours   Complete by:  As directed    Your stitches are under the scan and will dissolve by themselves. The Steri-Strips will fall off after you take a few showers. Do not rub back or pick at the wound, Leave the wound alone.     Allergies as of 12/29/2017      Reactions   Codeine Nausea Only   Morphine And Related Other (See Comments)   Alters heart rate      Medication List    STOP taking these medications   acetaminophen 500 MG tablet Commonly known as:  TYLENOL   HYDROcodone-acetaminophen 5-325 MG tablet Commonly known as:  NORCO/VICODIN   methocarbamol  500 MG tablet Commonly known as:  ROBAXIN     TAKE these medications   amLODipine 5 MG tablet Commonly known as:  NORVASC Take 5 mg by mouth daily.   aspirin EC 81 MG tablet Take 81 mg by mouth daily.   atorvastatin 80 MG tablet Commonly known as:  LIPITOR Take 80 mg by mouth daily.   carvedilol 25 MG tablet Commonly known as:  COREG Take 25 mg by mouth 2 (two) times daily.   cyclobenzaprine 10 MG tablet Commonly known as:  FLEXERIL Take 1 tablet (10 mg total) by mouth 3 (three) times daily as needed for muscle spasms.   cycloSPORINE 0.05 % ophthalmic emulsion Commonly known as:  RESTASIS Place 1 drop into both eyes 2 (two) times daily.   docusate sodium 100 MG  capsule Commonly known as:  COLACE Take 1 capsule (100 mg total) by mouth 2 (two) times daily.   exenatide 10 MCG/0.04ML Sopn injection Commonly known as:  BYETTA Inject 10 mcg into the skin 2 (two) times daily with a meal.   Fish Oil 1200 MG Caps Take 3,600 mg by mouth daily.   glimepiride 4 MG tablet Commonly known as:  AMARYL Take 2 mg by mouth daily with breakfast.   HYDROmorphone 2 MG tablet Commonly known as:  DILAUDID Take 1 tablet (2 mg total) by mouth every 4 (four) hours as needed for moderate pain.   irbesartan 300 MG tablet Commonly known as:  AVAPRO Take 300 mg by mouth daily.   lactobacillus acidophilus Tabs tablet Take 1 tablet by mouth daily.   LANTUS SOLOSTAR 100 UNIT/ML Solostar Pen Generic drug:  Insulin Glargine Inject 40 Units into the skin at bedtime.   levothyroxine 100 MCG tablet Commonly known as:  SYNTHROID, LEVOTHROID Take 100 mcg by mouth daily before breakfast.   loratadine 10 MG tablet Commonly known as:  CLARITIN Take 10 mg by mouth daily as needed for allergies.   metFORMIN 500 MG 24 hr tablet Commonly known as:  GLUCOPHAGE-XR Take 500-1,000 mg by mouth See admin instructions. Take 1000 mg by mouth in the morning and 500 mg in the evening   niacin 500 MG tablet Take 500 mg by mouth at bedtime.   omeprazole 20 MG capsule Commonly known as:  PRILOSEC Take 20 mg by mouth every other day.   potassium chloride SA 20 MEQ tablet Commonly known as:  K-DUR,KLOR-CON Take 20 mEq by mouth 2 (two) times daily.   sertraline 100 MG tablet Commonly known as:  ZOLOFT Take 100 mg by mouth at bedtime.   VITAMIN B-12 IJ Inject 1,000 mcg as directed every 30 (thirty) days.   Vitamin D3 50 MCG (2000 UT) Tabs Take 2,000 Units by mouth daily.        Signed: Ophelia Charter 12/29/2017, 8:15 AM

## 2017-12-29 NOTE — Progress Notes (Signed)
PT Cancellation Note  Patient Details Name: [name] MRN: [id number] DOB: April 20, 1945   Cancelled Treatment:    Reason Eval/Treat Not Completed: Other (comment) attempted to see patient, nursing stopped therapist at nursing station and reported that patient has already been discharged/has already left. Thank you for the referral.   Deniece Ree PT, DPT, CBIS  Supplemental Physical Therapist [facility]    Pager [phone] Acute Rehab Office [phone]

## 2017-12-29 NOTE — Progress Notes (Signed)
Pt doing well. Pt and wife given D/C instructions with verbal understanding. Rx's were sent to Pt's pharmacy by MD. Pt's incision has no sign of infection and is covered with Honeycomb dressing. Pt's IV was removed prior to D/C. Pt D/C'd home via wheelchair @ 1005 per MD order. Pt is stable @ D/C and has no other needs at this time. Holli Humbles, RN

## 2019-05-29 DIAGNOSIS — C44311 Basal cell carcinoma of skin of nose: Secondary | ICD-10-CM | POA: Diagnosis not present

## 2019-05-29 DIAGNOSIS — R208 Other disturbances of skin sensation: Secondary | ICD-10-CM | POA: Diagnosis not present

## 2019-05-29 DIAGNOSIS — D225 Melanocytic nevi of trunk: Secondary | ICD-10-CM | POA: Diagnosis not present

## 2019-05-29 DIAGNOSIS — L570 Actinic keratosis: Secondary | ICD-10-CM | POA: Diagnosis not present

## 2019-05-29 DIAGNOSIS — L820 Inflamed seborrheic keratosis: Secondary | ICD-10-CM | POA: Diagnosis not present

## 2019-05-29 DIAGNOSIS — D485 Neoplasm of uncertain behavior of skin: Secondary | ICD-10-CM | POA: Diagnosis not present

## 2019-05-29 DIAGNOSIS — L821 Other seborrheic keratosis: Secondary | ICD-10-CM | POA: Diagnosis not present

## 2019-05-29 DIAGNOSIS — L814 Other melanin hyperpigmentation: Secondary | ICD-10-CM | POA: Diagnosis not present

## 2019-05-29 DIAGNOSIS — D1801 Hemangioma of skin and subcutaneous tissue: Secondary | ICD-10-CM | POA: Diagnosis not present

## 2019-06-20 DIAGNOSIS — E538 Deficiency of other specified B group vitamins: Secondary | ICD-10-CM | POA: Diagnosis not present

## 2019-06-28 ENCOUNTER — Other Ambulatory Visit: Payer: Self-pay | Admitting: Urology

## 2019-06-28 DIAGNOSIS — C61 Malignant neoplasm of prostate: Secondary | ICD-10-CM

## 2019-07-02 DIAGNOSIS — M24811 Other specific joint derangements of right shoulder, not elsewhere classified: Secondary | ICD-10-CM | POA: Diagnosis not present

## 2019-07-02 DIAGNOSIS — M24812 Other specific joint derangements of left shoulder, not elsewhere classified: Secondary | ICD-10-CM | POA: Diagnosis not present

## 2019-07-09 DIAGNOSIS — E1142 Type 2 diabetes mellitus with diabetic polyneuropathy: Secondary | ICD-10-CM | POA: Diagnosis not present

## 2019-07-13 DIAGNOSIS — L810 Postinflammatory hyperpigmentation: Secondary | ICD-10-CM | POA: Diagnosis not present

## 2019-07-13 DIAGNOSIS — L820 Inflamed seborrheic keratosis: Secondary | ICD-10-CM | POA: Diagnosis not present

## 2019-07-16 DIAGNOSIS — M48062 Spinal stenosis, lumbar region with neurogenic claudication: Secondary | ICD-10-CM | POA: Diagnosis not present

## 2019-07-19 DIAGNOSIS — D510 Vitamin B12 deficiency anemia due to intrinsic factor deficiency: Secondary | ICD-10-CM | POA: Diagnosis not present

## 2019-07-20 DIAGNOSIS — C44311 Basal cell carcinoma of skin of nose: Secondary | ICD-10-CM | POA: Diagnosis not present

## 2019-07-31 DIAGNOSIS — C61 Malignant neoplasm of prostate: Secondary | ICD-10-CM | POA: Diagnosis not present

## 2019-08-01 ENCOUNTER — Other Ambulatory Visit: Payer: Self-pay

## 2019-08-01 ENCOUNTER — Ambulatory Visit
Admission: RE | Admit: 2019-08-01 | Discharge: 2019-08-01 | Disposition: A | Discharge status: Home / Self Care | Payer: Medicare PPO | Source: Ambulatory Visit | Attending: Urology | Admitting: Urology

## 2019-08-01 DIAGNOSIS — R972 Elevated prostate specific antigen [PSA]: Secondary | ICD-10-CM | POA: Diagnosis not present

## 2019-08-01 DIAGNOSIS — C61 Malignant neoplasm of prostate: Secondary | ICD-10-CM

## 2019-08-01 MED ORDER — GADOBENATE DIMEGLUMINE 529 MG/ML IV SOLN
19.0000 mL | Freq: Once | INTRAVENOUS | Status: AC | PRN
  Administered 2019-08-01: 19 mL via INTRAVENOUS

## 2019-08-06 DIAGNOSIS — N5201 Erectile dysfunction due to arterial insufficiency: Secondary | ICD-10-CM | POA: Diagnosis not present

## 2019-08-06 DIAGNOSIS — C61 Malignant neoplasm of prostate: Secondary | ICD-10-CM | POA: Diagnosis not present

## 2019-08-16 DIAGNOSIS — M48062 Spinal stenosis, lumbar region with neurogenic claudication: Secondary | ICD-10-CM | POA: Diagnosis not present

## 2019-08-20 DIAGNOSIS — D510 Vitamin B12 deficiency anemia due to intrinsic factor deficiency: Secondary | ICD-10-CM | POA: Diagnosis not present

## 2019-09-05 DIAGNOSIS — L989 Disorder of the skin and subcutaneous tissue, unspecified: Secondary | ICD-10-CM | POA: Diagnosis not present

## 2019-09-05 DIAGNOSIS — D485 Neoplasm of uncertain behavior of skin: Secondary | ICD-10-CM | POA: Diagnosis not present

## 2019-09-05 DIAGNOSIS — L821 Other seborrheic keratosis: Secondary | ICD-10-CM | POA: Diagnosis not present

## 2019-09-05 HISTORY — PX: BASAL CELL CARCINOMA EXCISION: SHX1214

## 2019-09-06 DIAGNOSIS — C61 Malignant neoplasm of prostate: Secondary | ICD-10-CM | POA: Diagnosis not present

## 2019-09-18 DIAGNOSIS — I10 Essential (primary) hypertension: Secondary | ICD-10-CM | POA: Diagnosis not present

## 2019-09-18 DIAGNOSIS — Z9889 Other specified postprocedural states: Secondary | ICD-10-CM | POA: Diagnosis not present

## 2019-09-18 DIAGNOSIS — R2681 Unsteadiness on feet: Secondary | ICD-10-CM | POA: Diagnosis not present

## 2019-09-18 DIAGNOSIS — M48061 Spinal stenosis, lumbar region without neurogenic claudication: Secondary | ICD-10-CM | POA: Diagnosis not present

## 2019-09-19 DIAGNOSIS — D510 Vitamin B12 deficiency anemia due to intrinsic factor deficiency: Secondary | ICD-10-CM | POA: Diagnosis not present

## 2019-09-19 DIAGNOSIS — E559 Vitamin D deficiency, unspecified: Secondary | ICD-10-CM | POA: Diagnosis not present

## 2019-09-19 DIAGNOSIS — G629 Polyneuropathy, unspecified: Secondary | ICD-10-CM | POA: Diagnosis not present

## 2019-09-19 DIAGNOSIS — E782 Mixed hyperlipidemia: Secondary | ICD-10-CM | POA: Diagnosis not present

## 2019-09-19 DIAGNOSIS — E039 Hypothyroidism, unspecified: Secondary | ICD-10-CM | POA: Diagnosis not present

## 2019-09-19 DIAGNOSIS — E1140 Type 2 diabetes mellitus with diabetic neuropathy, unspecified: Secondary | ICD-10-CM | POA: Diagnosis not present

## 2019-09-19 DIAGNOSIS — E538 Deficiency of other specified B group vitamins: Secondary | ICD-10-CM | POA: Diagnosis not present

## 2019-09-19 DIAGNOSIS — I10 Essential (primary) hypertension: Secondary | ICD-10-CM | POA: Diagnosis not present

## 2019-09-26 DIAGNOSIS — E039 Hypothyroidism, unspecified: Secondary | ICD-10-CM | POA: Diagnosis not present

## 2019-09-26 DIAGNOSIS — D510 Vitamin B12 deficiency anemia due to intrinsic factor deficiency: Secondary | ICD-10-CM | POA: Diagnosis not present

## 2019-09-26 DIAGNOSIS — Z0000 Encounter for general adult medical examination without abnormal findings: Secondary | ICD-10-CM | POA: Diagnosis not present

## 2019-09-26 DIAGNOSIS — E538 Deficiency of other specified B group vitamins: Secondary | ICD-10-CM | POA: Diagnosis not present

## 2019-09-26 DIAGNOSIS — I10 Essential (primary) hypertension: Secondary | ICD-10-CM | POA: Diagnosis not present

## 2019-09-26 DIAGNOSIS — F324 Major depressive disorder, single episode, in partial remission: Secondary | ICD-10-CM | POA: Diagnosis not present

## 2019-09-26 DIAGNOSIS — E559 Vitamin D deficiency, unspecified: Secondary | ICD-10-CM | POA: Diagnosis not present

## 2019-09-26 DIAGNOSIS — E1140 Type 2 diabetes mellitus with diabetic neuropathy, unspecified: Secondary | ICD-10-CM | POA: Diagnosis not present

## 2019-09-26 DIAGNOSIS — E782 Mixed hyperlipidemia: Secondary | ICD-10-CM | POA: Diagnosis not present

## 2019-09-27 DIAGNOSIS — M542 Cervicalgia: Secondary | ICD-10-CM | POA: Diagnosis not present

## 2019-09-27 DIAGNOSIS — R2681 Unsteadiness on feet: Secondary | ICD-10-CM | POA: Diagnosis not present

## 2019-09-27 DIAGNOSIS — M48061 Spinal stenosis, lumbar region without neurogenic claudication: Secondary | ICD-10-CM | POA: Diagnosis not present

## 2019-09-27 DIAGNOSIS — M5127 Other intervertebral disc displacement, lumbosacral region: Secondary | ICD-10-CM | POA: Diagnosis not present

## 2019-09-27 DIAGNOSIS — M47816 Spondylosis without myelopathy or radiculopathy, lumbar region: Secondary | ICD-10-CM | POA: Diagnosis not present

## 2019-10-01 DIAGNOSIS — M4802 Spinal stenosis, cervical region: Secondary | ICD-10-CM | POA: Diagnosis not present

## 2019-10-01 DIAGNOSIS — M48062 Spinal stenosis, lumbar region with neurogenic claudication: Secondary | ICD-10-CM | POA: Diagnosis not present

## 2019-10-01 DIAGNOSIS — G992 Myelopathy in diseases classified elsewhere: Secondary | ICD-10-CM | POA: Diagnosis not present

## 2019-10-03 ENCOUNTER — Other Ambulatory Visit: Payer: Self-pay | Admitting: Neurosurgery

## 2019-10-03 DIAGNOSIS — R972 Elevated prostate specific antigen [PSA]: Secondary | ICD-10-CM | POA: Diagnosis not present

## 2019-10-03 DIAGNOSIS — C61 Malignant neoplasm of prostate: Secondary | ICD-10-CM | POA: Diagnosis not present

## 2019-10-03 HISTORY — PX: BIOPSY PROSTATE: PRO28

## 2019-10-08 DIAGNOSIS — I739 Peripheral vascular disease, unspecified: Secondary | ICD-10-CM | POA: Diagnosis not present

## 2019-10-08 DIAGNOSIS — L603 Nail dystrophy: Secondary | ICD-10-CM | POA: Diagnosis not present

## 2019-10-08 DIAGNOSIS — E1151 Type 2 diabetes mellitus with diabetic peripheral angiopathy without gangrene: Secondary | ICD-10-CM | POA: Diagnosis not present

## 2019-10-17 DIAGNOSIS — C61 Malignant neoplasm of prostate: Secondary | ICD-10-CM | POA: Diagnosis not present

## 2019-10-18 DIAGNOSIS — D510 Vitamin B12 deficiency anemia due to intrinsic factor deficiency: Secondary | ICD-10-CM | POA: Diagnosis not present

## 2019-10-23 ENCOUNTER — Other Ambulatory Visit: Payer: Self-pay | Admitting: Neurosurgery

## 2019-10-25 DIAGNOSIS — C61 Malignant neoplasm of prostate: Secondary | ICD-10-CM | POA: Diagnosis not present

## 2019-10-25 DIAGNOSIS — R300 Dysuria: Secondary | ICD-10-CM | POA: Diagnosis not present

## 2019-10-31 NOTE — Pre-Procedure Instructions (Signed)
[facility] [place], [place] AT [address] [place] [address] Phone: [phone] Fax: [phone]      Your procedure is scheduled on Monday September 13th.  Report to [facility] Main Entrance "A" at 8:00 A.M., and check in at the Admitting office.  Call this number if you have problems the morning of surgery:  [phone]  Call [phone] if you have any questions prior to your surgery date Monday-Friday 8am-4pm    Remember:  Do not eat or drink after midnight the night before your surgery   Take these medicines the morning of surgery with A SIP OF WATER  amLODipine (NORVASC) atorvastatin (LIPITOR) carvedilol (COREG)  levothyroxine (SYNTHROID, LEVOTHROID) omeprazole (PRILOSEC)  As of today, STOP taking any Aspirin (unless otherwise instructed by your surgeon) Aleve, Naproxen, Ibuprofen, Motrin, Advil, Goody's, BC's, all herbal medications, fish oil, and all vitamins.   HOW TO MANAGE YOUR DIABETES BEFORE AND AFTER SURGERY  Why is it important to control my blood sugar before and after surgery?  Improving blood sugar levels before and after surgery helps healing and can limit problems.  A way of improving blood sugar control is eating a healthy diet by: o  Eating less sugar and carbohydrates o  Increasing activity/exercise o  Talking with your doctor about reaching your blood sugar goals  High blood sugars (greater than 180 mg/dL) can raise your risk of infections and slow your recovery, so you will need to focus on controlling your diabetes during the weeks before surgery.  Make sure that the doctor who takes care of your diabetes knows about your planned surgery including the date and location.  How do I manage my blood sugar before surgery?  Check your blood sugar at least 4 times a day, starting 2 days before surgery, to make sure that the level is not too high or low. o Check your  blood sugar the morning of your surgery when you wake up and every 2 hours until you get to the Short Stay unit.  If your blood sugar is less than 70 mg/dL, you will need to treat for low blood sugar: o Do not take insulin. o Treat a low blood sugar (less than 70 mg/dL) with  cup of clear juice (cranberry or apple), 4 glucose tablets, OR glucose gel. Recheck blood sugar in 15 minutes after treatment (to make sure it is greater than 70 mg/dL). If your blood sugar is not greater than 70 mg/dL on recheck, call [phone] o  for further instructions.  Report your blood sugar to the short stay nurse when you get to Short Stay.   If you are admitted to the hospital after surgery: o Your blood sugar will be checked by the staff and you will probably be given insulin after surgery (instead of oral diabetes medicines) to make sure you have good blood sugar levels. o The goal for blood sugar control after surgery is 80-180 mg/dL.     WHAT DO I DO ABOUT MY DIABETES MEDICATION?    Do not take oral diabetes medicines (pills): metFORMIN (GLUCOPHAGE-XR)  the morning of surgery.   THE NIGHT BEFORE SURGERY, take 25 units of LANTUS SOLOSTAR insulin.       The day of surgery, do not take other diabetes injectables, including Byetta (exenatide), Bydureon (exenatide ER), Victoza (liraglutide), or Trulicity (dulaglutide).  Do not wear jewelry, make up, or nail polish            Do not wear lotions, powders, perfumes/colognes, or deodorant.            Do not shave 48 hours prior to surgery.  Men may shave face and neck.            Do not bring valuables to the hospital.            [facility] is not responsible for any belongings or valuables.  Do NOT Smoke (Tobacco/Vaping) or drink Alcohol 24 hours prior to your procedure If you use a CPAP at night, you may bring all equipment for your overnight stay.   Contacts, glasses, dentures or bridgework may not be worn into surgery.       For patients admitted to the hospital, discharge time will be determined by your treatment team.   Patients discharged the day of surgery will not be allowed to drive home, and someone needs to stay with them for 24 hours.    Special instructions:   [facility]- Preparing For Surgery  Before surgery, you can play an important role. Because skin is not sterile, your skin needs to be as free of germs as possible. You can reduce the number of germs on your skin by washing with CHG (chlorahexidine gluconate) Soap before surgery.  CHG is an antiseptic cleaner which kills germs and bonds with the skin to continue killing germs even after washing.    Oral Hygiene is also important to reduce your risk of infection.  Remember - BRUSH YOUR TEETH THE MORNING OF SURGERY WITH YOUR REGULAR TOOTHPASTE  Please do not use if you have an allergy to CHG or antibacterial soaps. If your skin becomes reddened/irritated stop using the CHG.  Do not shave (including legs and underarms) for at least 48 hours prior to first CHG shower. It is OK to shave your face.  Please follow these instructions carefully.   1. Shower the NIGHT BEFORE SURGERY and the MORNING OF SURGERY with CHG Soap.   2. If you chose to wash your hair, wash your hair first as usual with your normal shampoo.  3. After you shampoo, rinse your hair and body thoroughly to remove the shampoo.  4. Use CHG as you would any other liquid soap. You can apply CHG directly to the skin and wash gently with a scrungie or a clean washcloth.   5. Apply the CHG Soap to your body ONLY FROM THE NECK DOWN.  Do not use on open wounds or open sores. Avoid contact with your eyes, ears, mouth and genitals (private parts). Wash Face and genitals (private parts)  with your normal soap.   6. Wash thoroughly, paying special attention to the area where your surgery will be performed.  7. Thoroughly rinse your body with warm water from the neck down.  8. DO NOT  shower/wash with your normal soap after using and rinsing off the CHG Soap.  9. Pat yourself dry with a CLEAN TOWEL.  10. Wear CLEAN PAJAMAS to bed the night before surgery  11. Place CLEAN SHEETS on your bed the night of your first shower and DO NOT SLEEP WITH PETS.   Day of Surgery: Wear Clean/Comfortable clothing the morning of surgery Do not apply any deodorants/lotions.   Remember to brush your teeth WITH YOUR REGULAR TOOTHPASTE.   Please read over the following fact sheets that you were given.

## 2019-11-01 ENCOUNTER — Encounter (HOSPITAL_COMMUNITY): Payer: Self-pay

## 2019-11-01 ENCOUNTER — Other Ambulatory Visit: Payer: Self-pay

## 2019-11-01 ENCOUNTER — Other Ambulatory Visit (HOSPITAL_COMMUNITY)
Admission: RE | Admit: 2019-11-01 | Discharge: 2019-11-01 | Disposition: A | Discharge status: Home / Self Care | Payer: Medicare PPO | Source: Ambulatory Visit | Attending: Neurosurgery | Admitting: Neurosurgery

## 2019-11-01 ENCOUNTER — Encounter (HOSPITAL_COMMUNITY)
Admission: RE | Admit: 2019-11-01 | Discharge: 2019-11-01 | Disposition: A | Discharge status: Home / Self Care | Payer: Medicare PPO | Source: Ambulatory Visit | Attending: Neurosurgery | Admitting: Neurosurgery

## 2019-11-01 DIAGNOSIS — Z01818 Encounter for other preprocedural examination: Secondary | ICD-10-CM | POA: Diagnosis not present

## 2019-11-01 DIAGNOSIS — Z20822 Contact with and (suspected) exposure to covid-19: Secondary | ICD-10-CM | POA: Insufficient documentation

## 2019-11-01 DIAGNOSIS — Z01812 Encounter for preprocedural laboratory examination: Secondary | ICD-10-CM | POA: Insufficient documentation

## 2019-11-01 HISTORY — DX: Gastro-esophageal reflux disease without esophagitis: K21.9

## 2019-11-01 HISTORY — DX: Personal history of other diseases of the digestive system: Z87.19

## 2019-11-01 LAB — SARS CORONAVIRUS 2 (TAT 6-24 HRS): SARS Coronavirus 2: NEGATIVE

## 2019-11-01 LAB — BASIC METABOLIC PANEL
Anion gap: 10 (ref 5–15)
BUN: 19 mg/dL (ref 8–23)
CO2: 21 mmol/L — ABNORMAL LOW (ref 22–32)
Calcium: 9.4 mg/dL (ref 8.9–10.3)
Chloride: 102 mmol/L (ref 98–111)
Creatinine, Ser: 1.24 mg/dL (ref 0.61–1.24)
GFR calc Af Amer: >60 mL/min (ref >60)
GFR calc non Af Amer: 57 mL/min — ABNORMAL LOW (ref >60)
Glucose, Bld: 299 mg/dL — ABNORMAL HIGH (ref 70–99)
Potassium: 4.8 mmol/L (ref 3.5–5.1)
Sodium: 133 mmol/L — ABNORMAL LOW (ref 135–145)

## 2019-11-01 LAB — CBC
HCT: 38 % — ABNORMAL LOW (ref 39.0–52.0)
Hemoglobin: 12.2 g/dL — ABNORMAL LOW (ref 13.0–17.0)
MCH: 28.8 pg (ref 26.0–34.0)
MCHC: 32.1 g/dL (ref 30.0–36.0)
MCV: 89.6 fL (ref 80.0–100.0)
Platelets: 291 10*3/uL (ref 150–400)
RBC: 4.24 MIL/uL (ref 4.22–5.81)
RDW: 13 % (ref 11.5–15.5)
WBC: 10.9 10*3/uL — ABNORMAL HIGH (ref 4.0–10.5)
nRBC: 0 % (ref 0.0–0.2)

## 2019-11-01 LAB — SURGICAL PCR SCREEN
MRSA, PCR: NEGATIVE
Staphylococcus aureus: NEGATIVE

## 2019-11-01 LAB — TYPE AND SCREEN
ABO/RH(D): B POS
Antibody Screen: NEGATIVE

## 2019-11-01 LAB — GLUCOSE, CAPILLARY: Glucose-Capillary: 289 mg/dL — ABNORMAL HIGH (ref 70–99)

## 2019-11-01 LAB — HEMOGLOBIN A1C
Hgb A1c MFr Bld: 7.8 % — ABNORMAL HIGH (ref 4.8–5.6)
Mean Plasma Glucose: 177.16 mg/dL

## 2019-11-01 NOTE — Progress Notes (Addendum)
PCP - Dr. Melinda Crutch with [facility] Cardiologist - Denies  Chest x-ray - Not indicated EKG - 11/01/19 Stress Test - Yes cannot remember date  ECHO - Denies Cardiac Cath - Denies  Sleep Study - Denies OSA  DM Type II CBG 289 at PAT appt @ 6701, Had eaten biscuit @ 0900 Fasting Blood Sugar -  Ranges 85-150 Checks Blood Sugar BID  Aspirin Instructions: Last dose of Aspirin 10/28/19  COVID TEST- 11/01/19  Anesthesia review: Yes CBG 289 at PAT appt, A1C checked 7.8  Patient denies shortness of breath, fever, cough and chest pain at PAT appointment   All instructions explained to the patient, with a verbal understanding of the material. Patient agrees to go over the instructions while at home for a better understanding. Patient also instructed to self quarantine after being tested for COVID-19. The opportunity to ask questions was provided.

## 2019-11-02 DIAGNOSIS — R300 Dysuria: Secondary | ICD-10-CM | POA: Diagnosis not present

## 2019-11-02 DIAGNOSIS — N3000 Acute cystitis without hematuria: Secondary | ICD-10-CM | POA: Diagnosis not present

## 2019-11-02 NOTE — Anesthesia Preprocedure Evaluation (Addendum)
Anesthesia Evaluation  Patient identified by MRN, date of birth, ID band Patient awake    Reviewed: Allergy & Precautions, NPO status , Patient's Chart, lab work & pertinent test results, reviewed documented beta blocker date and time   History of Anesthesia Complications Negative for: history of anesthetic complications  Airway Mallampati: II  TM Distance: >3 FB Neck ROM: Full    Dental  (+) Teeth Intact, Dental Advisory Given   Pulmonary neg pulmonary ROS,  11/01/2019 SARS coronavirus NEG   breath sounds clear to auscultation       Cardiovascular hypertension, Pt. on medications and Pt. on home beta blockers (-) angina Rhythm:Regular Rate:Normal     Neuro/Psych Anxiety    GI/Hepatic Neg liver ROS, GERD  Medicated and Controlled,  Endo/Other  diabetes (glu 158), Oral Hypoglycemic Agents, Insulin DependentHypothyroidism   Renal/GU negative Renal ROS   Prostate cancer    Musculoskeletal   Abdominal   Peds  Hematology negative hematology ROS (+)   Anesthesia Other Findings   Reproductive/Obstetrics                           Anesthesia Physical Anesthesia Plan  ASA: III  Anesthesia Plan: General   Post-op Pain Management:    Induction: Intravenous  PONV Risk Score and Plan: 2 and Ondansetron and Dexamethasone  Airway Management Planned: Oral ETT and Video Laryngoscope Planned  Additional Equipment: None  Intra-op Plan:   Post-operative Plan: Extubation in OR  Informed Consent: I have reviewed the patients History and Physical, chart, labs and discussed the procedure including the risks, benefits and alternatives for the proposed anesthesia with the patient or authorized representative who has indicated his/her understanding and acceptance.     Dental advisory given  Plan Discussed with: CRNA and Surgeon  Anesthesia Plan Comments: (Poorly controlled IDDMII. Dr. Arnoldo Morale made  aware. )     Anesthesia Quick Evaluation

## 2019-11-05 ENCOUNTER — Inpatient Hospital Stay (HOSPITAL_COMMUNITY): Payer: Medicare PPO | Admitting: Certified Registered"

## 2019-11-05 ENCOUNTER — Encounter (HOSPITAL_COMMUNITY)
Admission: RE | Disposition: A | Discharge status: Home / Self Care | Payer: Self-pay | Source: Home / Self Care | Attending: Neurosurgery

## 2019-11-05 ENCOUNTER — Inpatient Hospital Stay (HOSPITAL_COMMUNITY): Payer: Medicare PPO | Admitting: Physician Assistant

## 2019-11-05 ENCOUNTER — Inpatient Hospital Stay (HOSPITAL_COMMUNITY)
Admission: RE | Admit: 2019-11-05 | Discharge: 2019-11-06 | DRG: 472 | Disposition: A | Discharge status: Home / Self Care | Payer: Medicare PPO | Attending: Neurosurgery | Admitting: Neurosurgery

## 2019-11-05 ENCOUNTER — Inpatient Hospital Stay (HOSPITAL_COMMUNITY): Payer: Medicare PPO

## 2019-11-05 ENCOUNTER — Encounter (HOSPITAL_COMMUNITY): Payer: Self-pay | Admitting: Neurosurgery

## 2019-11-05 ENCOUNTER — Other Ambulatory Visit: Payer: Self-pay

## 2019-11-05 DIAGNOSIS — K449 Diaphragmatic hernia without obstruction or gangrene: Secondary | ICD-10-CM | POA: Diagnosis present

## 2019-11-05 DIAGNOSIS — K219 Gastro-esophageal reflux disease without esophagitis: Secondary | ICD-10-CM | POA: Diagnosis present

## 2019-11-05 DIAGNOSIS — G992 Myelopathy in diseases classified elsewhere: Secondary | ICD-10-CM | POA: Diagnosis present

## 2019-11-05 DIAGNOSIS — M4322 Fusion of spine, cervical region: Secondary | ICD-10-CM | POA: Diagnosis not present

## 2019-11-05 DIAGNOSIS — Z7984 Long term (current) use of oral hypoglycemic drugs: Secondary | ICD-10-CM

## 2019-11-05 DIAGNOSIS — I10 Essential (primary) hypertension: Secondary | ICD-10-CM | POA: Diagnosis present

## 2019-11-05 DIAGNOSIS — E119 Type 2 diabetes mellitus without complications: Secondary | ICD-10-CM | POA: Diagnosis present

## 2019-11-05 DIAGNOSIS — M5126 Other intervertebral disc displacement, lumbar region: Secondary | ICD-10-CM | POA: Diagnosis not present

## 2019-11-05 DIAGNOSIS — M4802 Spinal stenosis, cervical region: Principal | ICD-10-CM | POA: Diagnosis present

## 2019-11-05 DIAGNOSIS — E039 Hypothyroidism, unspecified: Secondary | ICD-10-CM | POA: Diagnosis present

## 2019-11-05 DIAGNOSIS — Z85828 Personal history of other malignant neoplasm of skin: Secondary | ICD-10-CM | POA: Diagnosis not present

## 2019-11-05 DIAGNOSIS — Z981 Arthrodesis status: Secondary | ICD-10-CM | POA: Diagnosis not present

## 2019-11-05 DIAGNOSIS — Z885 Allergy status to narcotic agent status: Secondary | ICD-10-CM

## 2019-11-05 DIAGNOSIS — Z79890 Hormone replacement therapy (postmenopausal): Secondary | ICD-10-CM | POA: Diagnosis not present

## 2019-11-05 DIAGNOSIS — Z7982 Long term (current) use of aspirin: Secondary | ICD-10-CM | POA: Diagnosis not present

## 2019-11-05 DIAGNOSIS — Z79899 Other long term (current) drug therapy: Secondary | ICD-10-CM

## 2019-11-05 DIAGNOSIS — Z419 Encounter for procedure for purposes other than remedying health state, unspecified: Secondary | ICD-10-CM

## 2019-11-05 DIAGNOSIS — M5001 Cervical disc disorder with myelopathy,  high cervical region: Secondary | ICD-10-CM | POA: Diagnosis not present

## 2019-11-05 HISTORY — PX: POSTERIOR CERVICAL FUSION/FORAMINOTOMY: SHX5038

## 2019-11-05 LAB — GLUCOSE, CAPILLARY
Glucose-Capillary: 158 mg/dL — ABNORMAL HIGH (ref 70–99)
Glucose-Capillary: 151 mg/dL — ABNORMAL HIGH (ref 70–99)
Glucose-Capillary: 302 mg/dL — ABNORMAL HIGH (ref 70–99)
Glucose-Capillary: 355 mg/dL — ABNORMAL HIGH (ref 70–99)

## 2019-11-05 LAB — ABO/RH: ABO/RH(D): B POS

## 2019-11-05 SURGERY — POSTERIOR CERVICAL FUSION/FORAMINOTOMY LEVEL 2
Anesthesia: General

## 2019-11-05 MED ORDER — SODIUM CHLORIDE 0.9 % IV SOLN
INTRAVENOUS | Status: DC | PRN
  Administered 2019-11-05: 500 mL

## 2019-11-05 MED ORDER — ONDANSETRON HCL 4 MG PO TABS: 4.0000 mg | ORAL_TABLET | Freq: Four times a day (QID) | ORAL | Status: DC | PRN

## 2019-11-05 MED ORDER — CYCLOBENZAPRINE HCL 10 MG PO TABS: 10.0000 mg | ORAL_TABLET | Freq: Three times a day (TID) | ORAL | Status: DC | PRN

## 2019-11-05 MED ORDER — ALUM & MAG HYDROXIDE-SIMETH 200-200-20 MG/5ML PO SUSP: 30.0000 mL | Freq: Four times a day (QID) | ORAL | Status: DC | PRN

## 2019-11-05 MED ORDER — PHENYLEPHRINE HCL (PRESSORS) 10 MG/ML IV SOLN
INTRAVENOUS | Status: DC | PRN
  Administered 2019-11-05 (×3): 80 ug via INTRAVENOUS

## 2019-11-05 MED ORDER — BUPIVACAINE LIPOSOME 1.3 % IJ SUSP
INTRAMUSCULAR | Status: DC | PRN
  Administered 2019-11-05: 20 mL

## 2019-11-05 MED ORDER — CARVEDILOL 25 MG PO TABS
25.0000 mg | ORAL_TABLET | Freq: Two times a day (BID) | ORAL | Status: DC
  Administered 2019-11-05: 25 mg via ORAL
  Filled 2019-11-05: qty 1

## 2019-11-05 MED ORDER — ROCURONIUM BROMIDE 10 MG/ML (PF) SYRINGE
PREFILLED_SYRINGE | INTRAVENOUS | Status: DC | PRN
  Administered 2019-11-05: 60 mg via INTRAVENOUS
  Administered 2019-11-05 (×2): 20 mg via INTRAVENOUS

## 2019-11-05 MED ORDER — CHLORHEXIDINE GLUCONATE CLOTH 2 % EX PADS: 6.0000 | MEDICATED_PAD | Freq: Once | CUTANEOUS | Status: DC

## 2019-11-05 MED ORDER — METFORMIN HCL ER 500 MG PO TB24: 500.0000 mg | ORAL_TABLET | Freq: Every day | ORAL | Status: DC

## 2019-11-05 MED ORDER — ATORVASTATIN CALCIUM 80 MG PO TABS
80.0000 mg | ORAL_TABLET | Freq: Every day | ORAL | Status: DC
  Administered 2019-11-05: 80 mg via ORAL
  Filled 2019-11-05: qty 1

## 2019-11-05 MED ORDER — CHLORHEXIDINE GLUCONATE 0.12 % MT SOLN
15.0000 mL | Freq: Once | OROMUCOSAL | Status: AC
  Administered 2019-11-05: 15 mL via OROMUCOSAL
  Filled 2019-11-05: qty 15

## 2019-11-05 MED ORDER — BACITRACIN ZINC 500 UNIT/GM EX OINT
TOPICAL_OINTMENT | CUTANEOUS | Status: AC
  Filled 2019-11-05: qty 28.35

## 2019-11-05 MED ORDER — METFORMIN HCL ER 500 MG PO TB24
1000.0000 mg | ORAL_TABLET | Freq: Every day | ORAL | Status: DC
  Administered 2019-11-06: 1000 mg via ORAL
  Filled 2019-11-05: qty 2

## 2019-11-05 MED ORDER — LEVOTHYROXINE SODIUM 100 MCG PO TABS
100.0000 ug | ORAL_TABLET | Freq: Every day | ORAL | Status: DC
  Administered 2019-11-06: 100 ug via ORAL
  Filled 2019-11-05: qty 1

## 2019-11-05 MED ORDER — SERTRALINE HCL 50 MG PO TABS
100.0000 mg | ORAL_TABLET | Freq: Every day | ORAL | Status: DC
  Administered 2019-11-05: 100 mg via ORAL
  Filled 2019-11-05: qty 2

## 2019-11-05 MED ORDER — DEXAMETHASONE SODIUM PHOSPHATE 10 MG/ML IJ SOLN
INTRAMUSCULAR | Status: DC | PRN
  Administered 2019-11-05: 10 mg via INTRAVENOUS

## 2019-11-05 MED ORDER — OXYCODONE HCL 5 MG/5ML PO SOLN: 5.0000 mg | Freq: Once | ORAL | Status: DC | PRN

## 2019-11-05 MED ORDER — HYDROMORPHONE HCL 1 MG/ML IJ SOLN: 1.0000 mg | INTRAMUSCULAR | Status: DC | PRN

## 2019-11-05 MED ORDER — MIDAZOLAM HCL 5 MG/5ML IJ SOLN
INTRAMUSCULAR | Status: DC | PRN
  Administered 2019-11-05: 1 mg via INTRAVENOUS

## 2019-11-05 MED ORDER — POTASSIUM CHLORIDE CRYS ER 20 MEQ PO TBCR
20.0000 meq | EXTENDED_RELEASE_TABLET | Freq: Two times a day (BID) | ORAL | Status: DC
  Administered 2019-11-05: 20 meq via ORAL
  Filled 2019-11-05: qty 1

## 2019-11-05 MED ORDER — BACID PO TABS
1.0000 | ORAL_TABLET | Freq: Every day | ORAL | Status: DC
  Administered 2019-11-05: 1 via ORAL
  Filled 2019-11-05 (×2): qty 1

## 2019-11-05 MED ORDER — DEXAMETHASONE SODIUM PHOSPHATE 10 MG/ML IJ SOLN
INTRAMUSCULAR | Status: AC
  Filled 2019-11-05: qty 1

## 2019-11-05 MED ORDER — ORAL CARE MOUTH RINSE: 15.0000 mL | Freq: Once | OROMUCOSAL | Status: AC

## 2019-11-05 MED ORDER — PANTOPRAZOLE SODIUM 40 MG IV SOLR: 40.0000 mg | Freq: Every day | INTRAVENOUS | Status: DC

## 2019-11-05 MED ORDER — ROCURONIUM BROMIDE 10 MG/ML (PF) SYRINGE
PREFILLED_SYRINGE | INTRAVENOUS | Status: AC
  Filled 2019-11-05: qty 10

## 2019-11-05 MED ORDER — OXYCODONE HCL 5 MG PO TABS: 5.0000 mg | ORAL_TABLET | Freq: Once | ORAL | Status: DC | PRN

## 2019-11-05 MED ORDER — BUPIVACAINE-EPINEPHRINE (PF) 0.5% -1:200000 IJ SOLN
INTRAMUSCULAR | Status: DC | PRN
  Administered 2019-11-05: 10 mL via PERINEURAL

## 2019-11-05 MED ORDER — METFORMIN HCL ER 500 MG PO TB24: 500.0000 mg | ORAL_TABLET | ORAL | Status: DC

## 2019-11-05 MED ORDER — BISACODYL 10 MG RE SUPP
10.0000 mg | Freq: Every day | RECTAL | Status: DC | PRN
  Filled 2019-11-05: qty 1

## 2019-11-05 MED ORDER — LACTATED RINGERS IV SOLN: INTRAVENOUS | Status: DC

## 2019-11-05 MED ORDER — 0.9 % SODIUM CHLORIDE (POUR BTL) OPTIME
TOPICAL | Status: DC | PRN
  Administered 2019-11-05: 1000 mL

## 2019-11-05 MED ORDER — DOCUSATE SODIUM 100 MG PO CAPS
100.0000 mg | ORAL_CAPSULE | Freq: Two times a day (BID) | ORAL | Status: DC
  Administered 2019-11-05 (×2): 100 mg via ORAL
  Filled 2019-11-05 (×2): qty 1

## 2019-11-05 MED ORDER — MIDAZOLAM HCL 2 MG/2ML IJ SOLN
INTRAMUSCULAR | Status: AC
  Filled 2019-11-05: qty 2

## 2019-11-05 MED ORDER — EPHEDRINE SULFATE 50 MG/ML IJ SOLN
INTRAMUSCULAR | Status: DC | PRN
  Administered 2019-11-05 (×2): 10 mg via INTRAVENOUS

## 2019-11-05 MED ORDER — SUCCINYLCHOLINE CHLORIDE 200 MG/10ML IV SOSY
PREFILLED_SYRINGE | INTRAVENOUS | Status: AC
  Filled 2019-11-05: qty 10

## 2019-11-05 MED ORDER — FENTANYL CITRATE (PF) 100 MCG/2ML IJ SOLN
INTRAMUSCULAR | Status: DC | PRN
Start: 2019-11-05 — End: 2019-11-05
  Administered 2019-11-05: 25 ug via INTRAVENOUS
  Administered 2019-11-05: 100 ug via INTRAVENOUS
  Administered 2019-11-05: 50 ug via INTRAVENOUS

## 2019-11-05 MED ORDER — LIDOCAINE 2% (20 MG/ML) 5 ML SYRINGE
INTRAMUSCULAR | Status: AC
  Filled 2019-11-05: qty 5

## 2019-11-05 MED ORDER — THROMBIN 5000 UNITS EX SOLR
OROMUCOSAL | Status: DC | PRN
  Administered 2019-11-05: 5 mL via TOPICAL

## 2019-11-05 MED ORDER — INSULIN GLARGINE 100 UNIT/ML ~~LOC~~ SOLN
50.0000 [IU] | Freq: Every day | SUBCUTANEOUS | Status: DC
  Administered 2019-11-05: 50 [IU] via SUBCUTANEOUS
  Filled 2019-11-05 (×2): qty 0.5

## 2019-11-05 MED ORDER — ACETAMINOPHEN 650 MG RE SUPP: 650.0000 mg | RECTAL | Status: DC | PRN

## 2019-11-05 MED ORDER — MENTHOL 3 MG MT LOZG: 1.0000 | LOZENGE | OROMUCOSAL | Status: DC | PRN

## 2019-11-05 MED ORDER — IRBESARTAN 300 MG PO TABS
300.0000 mg | ORAL_TABLET | Freq: Every day | ORAL | Status: DC
  Administered 2019-11-05: 300 mg via ORAL
  Filled 2019-11-05 (×2): qty 1

## 2019-11-05 MED ORDER — FENTANYL CITRATE (PF) 250 MCG/5ML IJ SOLN
INTRAMUSCULAR | Status: AC
  Filled 2019-11-05: qty 5

## 2019-11-05 MED ORDER — INSULIN ASPART 100 UNIT/ML ~~LOC~~ SOLN
0.0000 [IU] | Freq: Three times a day (TID) | SUBCUTANEOUS | Status: DC
  Administered 2019-11-06: 7 [IU] via SUBCUTANEOUS

## 2019-11-05 MED ORDER — HYDROMORPHONE HCL 1 MG/ML IJ SOLN: 0.2500 mg | INTRAMUSCULAR | Status: DC | PRN

## 2019-11-05 MED ORDER — ACETAMINOPHEN 325 MG PO TABS: 650.0000 mg | ORAL_TABLET | ORAL | Status: DC | PRN

## 2019-11-05 MED ORDER — BUPIVACAINE LIPOSOME 1.3 % IJ SUSP
20.0000 mL | Freq: Once | INTRAMUSCULAR | Status: DC
  Filled 2019-11-05: qty 20

## 2019-11-05 MED ORDER — ALBUMIN HUMAN 5 % IV SOLN: INTRAVENOUS | Status: DC | PRN

## 2019-11-05 MED ORDER — ONDANSETRON HCL 4 MG/2ML IJ SOLN: 4.0000 mg | Freq: Four times a day (QID) | INTRAMUSCULAR | Status: DC | PRN

## 2019-11-05 MED ORDER — CEFAZOLIN SODIUM-DEXTROSE 2-4 GM/100ML-% IV SOLN
2.0000 g | INTRAVENOUS | Status: DC
  Filled 2019-11-05: qty 100

## 2019-11-05 MED ORDER — INSULIN ASPART 100 UNIT/ML ~~LOC~~ SOLN
0.0000 [IU] | Freq: Every day | SUBCUTANEOUS | Status: DC
  Administered 2019-11-05: 5 [IU] via SUBCUTANEOUS

## 2019-11-05 MED ORDER — MIDAZOLAM HCL 2 MG/2ML IJ SOLN: 0.5000 mg | Freq: Once | INTRAMUSCULAR | Status: DC | PRN

## 2019-11-05 MED ORDER — MEPERIDINE HCL 25 MG/ML IJ SOLN: 6.2500 mg | INTRAMUSCULAR | Status: DC | PRN

## 2019-11-05 MED ORDER — PROMETHAZINE HCL 25 MG/ML IJ SOLN: 6.2500 mg | INTRAMUSCULAR | Status: DC | PRN

## 2019-11-05 MED ORDER — PHENYLEPHRINE HCL-NACL 10-0.9 MG/250ML-% IV SOLN
INTRAVENOUS | Status: DC | PRN
  Administered 2019-11-05: 50 ug/min via INTRAVENOUS

## 2019-11-05 MED ORDER — ONDANSETRON HCL 4 MG/2ML IJ SOLN
INTRAMUSCULAR | Status: AC
  Filled 2019-11-05: qty 2

## 2019-11-05 MED ORDER — THROMBIN 20000 UNITS EX SOLR
CUTANEOUS | Status: AC
  Filled 2019-11-05: qty 20000

## 2019-11-05 MED ORDER — THROMBIN 5000 UNITS EX SOLR
CUTANEOUS | Status: AC
  Filled 2019-11-05: qty 5000

## 2019-11-05 MED ORDER — PROPOFOL 10 MG/ML IV BOLUS
INTRAVENOUS | Status: DC | PRN
  Administered 2019-11-05: 100 mg via INTRAVENOUS

## 2019-11-05 MED ORDER — OXYCODONE HCL 5 MG PO TABS
5.0000 mg | ORAL_TABLET | ORAL | Status: DC | PRN
  Administered 2019-11-05: 5 mg via ORAL
  Filled 2019-11-05: qty 1

## 2019-11-05 MED ORDER — PHENYLEPHRINE 40 MCG/ML (10ML) SYRINGE FOR IV PUSH (FOR BLOOD PRESSURE SUPPORT)
PREFILLED_SYRINGE | INTRAVENOUS | Status: AC
  Filled 2019-11-05: qty 10

## 2019-11-05 MED ORDER — LIDOCAINE 2% (20 MG/ML) 5 ML SYRINGE
INTRAMUSCULAR | Status: DC | PRN
  Administered 2019-11-05: 40 mg via INTRAVENOUS

## 2019-11-05 MED ORDER — PROPOFOL 10 MG/ML IV BOLUS
INTRAVENOUS | Status: AC
  Filled 2019-11-05: qty 20

## 2019-11-05 MED ORDER — CEFAZOLIN SODIUM-DEXTROSE 2-3 GM-%(50ML) IV SOLR
INTRAVENOUS | Status: DC | PRN
  Administered 2019-11-05: 2 g via INTRAVENOUS

## 2019-11-05 MED ORDER — BUPIVACAINE-EPINEPHRINE 0.5% -1:200000 IJ SOLN
INTRAMUSCULAR | Status: AC
  Filled 2019-11-05: qty 1

## 2019-11-05 MED ORDER — INSULIN ASPART 100 UNIT/ML ~~LOC~~ SOLN
0.0000 [IU] | Freq: Three times a day (TID) | SUBCUTANEOUS | Status: DC
  Administered 2019-11-05: 15 [IU] via SUBCUTANEOUS

## 2019-11-05 MED ORDER — SUGAMMADEX SODIUM 200 MG/2ML IV SOLN
INTRAVENOUS | Status: DC | PRN
  Administered 2019-11-05: 200 mg via INTRAVENOUS

## 2019-11-05 MED ORDER — THROMBIN 20000 UNITS EX SOLR
CUTANEOUS | Status: DC | PRN
  Administered 2019-11-05: 20 mL via TOPICAL

## 2019-11-05 MED ORDER — PHENOL 1.4 % MT LIQD: 1.0000 | OROMUCOSAL | Status: DC | PRN

## 2019-11-05 MED ORDER — DOXYCYCLINE HYCLATE 100 MG PO TABS
100.0000 mg | ORAL_TABLET | Freq: Two times a day (BID) | ORAL | Status: DC
  Administered 2019-11-05: 100 mg via ORAL
  Filled 2019-11-05 (×4): qty 1

## 2019-11-05 MED ORDER — CEFAZOLIN SODIUM-DEXTROSE 2-4 GM/100ML-% IV SOLN
2.0000 g | Freq: Three times a day (TID) | INTRAVENOUS | Status: AC
  Administered 2019-11-05 (×2): 2 g via INTRAVENOUS
  Filled 2019-11-05 (×2): qty 100

## 2019-11-05 MED ORDER — PANTOPRAZOLE SODIUM 40 MG PO TBEC
40.0000 mg | DELAYED_RELEASE_TABLET | Freq: Every day | ORAL | Status: DC
  Administered 2019-11-05: 40 mg via ORAL
  Filled 2019-11-05: qty 1

## 2019-11-05 MED ORDER — ACETAMINOPHEN 500 MG PO TABS
1000.0000 mg | ORAL_TABLET | Freq: Four times a day (QID) | ORAL | Status: DC
  Administered 2019-11-05 – 2019-11-06 (×3): 1000 mg via ORAL
  Filled 2019-11-05 (×3): qty 2

## 2019-11-05 MED ORDER — OXYCODONE HCL 5 MG PO TABS: 10.0000 mg | ORAL_TABLET | ORAL | Status: DC | PRN

## 2019-11-05 MED ORDER — ONDANSETRON HCL 4 MG/2ML IJ SOLN
INTRAMUSCULAR | Status: DC | PRN
  Administered 2019-11-05: 4 mg via INTRAVENOUS

## 2019-11-05 MED ORDER — AMLODIPINE BESYLATE 5 MG PO TABS: 10.0000 mg | ORAL_TABLET | Freq: Every day | ORAL | Status: DC

## 2019-11-05 MED ORDER — BACITRACIN ZINC 500 UNIT/GM EX OINT
TOPICAL_OINTMENT | CUTANEOUS | Status: DC | PRN
  Administered 2019-11-05: 1 via TOPICAL

## 2019-11-05 SURGICAL SUPPLY — 64 items
ADH SKN CLS APL DERMABOND .7 (GAUZE/BANDAGES/DRESSINGS) ×1
APL SKNCLS STERI-STRIP NONHPOA (GAUZE/BANDAGES/DRESSINGS) ×1
BAG DECANTER FOR FLEXI CONT (MISCELLANEOUS) ×2 IMPLANT
BAND INSRT 18 STRL LF DISP RB (MISCELLANEOUS)
BAND RUBBER #18 3X1/16 STRL (MISCELLANEOUS) IMPLANT
BASKET BONE COLLECTION (BASKET) ×1 IMPLANT
BENZOIN TINCTURE PRP APPL 2/3 (GAUZE/BANDAGES/DRESSINGS) ×2 IMPLANT
BIT DRILL NEURO 2X3.1 SFT TUCH (MISCELLANEOUS) ×1 IMPLANT
BLADE CLIPPER SURG (BLADE) IMPLANT
BLADE ULTRA TIP 2M (BLADE) IMPLANT
BUR PRECISION FLUTE 6.0 (BURR) ×1 IMPLANT
CANISTER SUCT 3000ML PPV (MISCELLANEOUS) ×2 IMPLANT
CAP CLSR POST CERV (Cap) ×6 IMPLANT
CARTRIDGE OIL MAESTRO DRILL (MISCELLANEOUS) ×1 IMPLANT
COVER WAND RF STERILE (DRAPES) ×2 IMPLANT
DERMABOND ADVANCED (GAUZE/BANDAGES/DRESSINGS) ×1
DERMABOND ADVANCED .7 DNX12 (GAUZE/BANDAGES/DRESSINGS) IMPLANT
DIFFUSER DRILL AIR PNEUMATIC (MISCELLANEOUS) ×2 IMPLANT
DRAPE C-ARM 42X72 X-RAY (DRAPES) ×4 IMPLANT
DRAPE LAPAROTOMY 100X72 PEDS (DRAPES) ×2 IMPLANT
DRAPE MICROSCOPE LEICA (MISCELLANEOUS) IMPLANT
DRAPE SURG 17X23 STRL (DRAPES) ×6 IMPLANT
DRILL NEURO 2X3.1 SOFT TOUCH (MISCELLANEOUS) ×2
DRSG OPSITE POSTOP 4X6 (GAUZE/BANDAGES/DRESSINGS) ×1 IMPLANT
ELECT BLADE 4.0 EZ CLEAN MEGAD (MISCELLANEOUS) ×2
ELECT REM PT RETURN 9FT ADLT (ELECTROSURGICAL) ×2
ELECTRODE BLDE 4.0 EZ CLN MEGD (MISCELLANEOUS) ×1 IMPLANT
ELECTRODE REM PT RTRN 9FT ADLT (ELECTROSURGICAL) ×1 IMPLANT
GAUZE 4X4 16PLY RFD (DISPOSABLE) IMPLANT
GAUZE SPONGE 4X4 12PLY STRL (GAUZE/BANDAGES/DRESSINGS) ×2 IMPLANT
GLOVE BIO SURGEON STRL SZ8 (GLOVE) ×2 IMPLANT
GLOVE BIO SURGEON STRL SZ8.5 (GLOVE) ×2 IMPLANT
GLOVE EXAM NITRILE XL STR (GLOVE) IMPLANT
GOWN STRL REUS W/ TWL LRG LVL3 (GOWN DISPOSABLE) IMPLANT
GOWN STRL REUS W/ TWL XL LVL3 (GOWN DISPOSABLE) ×1 IMPLANT
GOWN STRL REUS W/TWL 2XL LVL3 (GOWN DISPOSABLE) IMPLANT
GOWN STRL REUS W/TWL LRG LVL3 (GOWN DISPOSABLE) ×2
GOWN STRL REUS W/TWL XL LVL3 (GOWN DISPOSABLE) ×4
KIT BASIN OR (CUSTOM PROCEDURE TRAY) ×2 IMPLANT
KIT TURNOVER KIT B (KITS) ×2 IMPLANT
NDL SPNL 18GX3.5 QUINCKE PK (NEEDLE) ×1 IMPLANT
NEEDLE HYPO 22GX1.5 SAFETY (NEEDLE) ×2 IMPLANT
NEEDLE SPNL 18GX3.5 QUINCKE PK (NEEDLE) ×2 IMPLANT
NS IRRIG 1000ML POUR BTL (IV SOLUTION) ×2 IMPLANT
OIL CARTRIDGE MAESTRO DRILL (MISCELLANEOUS) ×2
PACK LAMINECTOMY NEURO (CUSTOM PROCEDURE TRAY) ×2 IMPLANT
PAD ARMBOARD 7.5X6 YLW CONV (MISCELLANEOUS) ×3 IMPLANT
PIN MAYFIELD SKULL DISP (PIN) ×2 IMPLANT
ROD VIRAGE 3.5X40 (Rod) ×2 IMPLANT
SCREW VIRAGE 3.5X14 (Screw) ×4 IMPLANT
SCREW VIRAGE 3.5X24MM (Screw) ×2 IMPLANT
SPONGE LAP 4X18 RFD (DISPOSABLE) IMPLANT
SPONGE SURGIFOAM ABS GEL 100 (HEMOSTASIS) ×2 IMPLANT
STAPLER SKIN PROX WIDE 3.9 (STAPLE) IMPLANT
STRIP CLOSURE SKIN 1/2X4 (GAUZE/BANDAGES/DRESSINGS) ×2 IMPLANT
SUT ETHILON 2 0 FS 18 (SUTURE) IMPLANT
SUT VIC AB 0 CT1 18XCR BRD8 (SUTURE) ×1 IMPLANT
SUT VIC AB 0 CT1 8-18 (SUTURE) ×2
SUT VIC AB 2-0 CP2 18 (SUTURE) ×2 IMPLANT
TOWEL GREEN STERILE (TOWEL DISPOSABLE) ×2 IMPLANT
TOWEL GREEN STERILE FF (TOWEL DISPOSABLE) ×2 IMPLANT
TRAY FOLEY MTR SLVR 16FR STAT (SET/KITS/TRAYS/PACK) ×1 IMPLANT
UNDERPAD 30X36 HEAVY ABSORB (UNDERPADS AND DIAPERS) IMPLANT
WATER STERILE IRR 1000ML POUR (IV SOLUTION) ×2 IMPLANT

## 2019-11-05 NOTE — Transfer of Care (Signed)
Immediate Anesthesia Transfer of Care Note  Patient: Dereon Corkery SRPRXYVO  Procedure(s) Performed: LAMINECTOMY AND FORAMINOTOMY, INSTRUMENTATION/FUSION CERVICAL TWO CERVICAL THREE, CERVICAL THREE- CERVICAL FOUR (N/A )  Patient Location: PACU  Anesthesia Type:General  Level of Consciousness: awake, alert  and sedated  Airway & Oxygen Therapy: Patient connected to face mask oxygen  Post-op Assessment: Post -op Vital signs reviewed and stable  Post vital signs: stable  Last Vitals:  Vitals Value Taken Time  BP 115/62 11/05/19 1259  Temp    Pulse 83 11/05/19 1300  Resp 19 11/05/19 1300  SpO2 100 % 11/05/19 1300  Vitals shown include unvalidated device data.  Last Pain:  Vitals:   11/05/19 0817  TempSrc:   PainSc: 0-No pain      Patients Stated Pain Goal: 5 (59/29/24 4628)  Complications: No complications documented.

## 2019-11-05 NOTE — Anesthesia Procedure Notes (Signed)
Procedure Name: Intubation Date/Time: 11/05/2019 10:01 AM Performed by: Lavell Luster, CRNA Pre-anesthesia Checklist: Patient identified, Emergency Drugs available, Suction available, Patient being monitored and Timeout performed Patient Re-evaluated:Patient Re-evaluated prior to induction Oxygen Delivery Method: Circle system utilized Preoxygenation: Pre-oxygenation with 100% oxygen Induction Type: IV induction Ventilation: Mask ventilation without difficulty Laryngoscope Size: Mac, 4 and Glidescope Grade View: Grade I Tube type: Oral Tube size: 7.5 mm Number of attempts: 1 Airway Equipment and Method: Stylet and Video-laryngoscopy Placement Confirmation: ETT inserted through vocal cords under direct vision,  positive ETCO2 and breath sounds checked- equal and bilateral Secured at: 21 cm Tube secured with: Tape Dental Injury: Teeth and Oropharynx as per pre-operative assessment

## 2019-11-05 NOTE — Op Note (Signed)
Brief history: The patient is a 74 year old white male on whom I previously performed a C3-4, C4-5 and C5-6 anterior cervicectomy, fusion and plating for severe cervical myelopathy.  Initially did well but has developed recurrent weakness.  He was worked up with a cervical MRI which demonstrated significant stenosis at C2-3 and C3-4.  We discussed the various treatment options and I recommended a C2-3 and C3-4 laminectomy with posterior instrumentation and fusion.  The patient has weighed the risks, benefits and alternatives and decided proceed with the surgery.  Preop diagnosis: Cervical spinal stenosis with myelopathy, cervicalgia  Postop diagnosis: The same  Procedure: C2-3 and C3-4 laminectomy; C2-3 and C3-4 posterior lateral arthrodesis with local morselized autograft bone; C2-C4 posterior instrumentation with lateral mass screws and rods (Zimmer)  Surgeon: Dr. Earle Gell  Assistant: Arnetha Massy, NP  Anesthesia: General tracheal  Estimated blood loss: 100 cc  Specimens: None  Drains: None  Complications: None  Description of procedure: The patient was brought to the operating room by the anesthesia team.  General endotracheal anesthesia was induced.  I applied the [place]field three-point headrest to the patient's calvarium.  He was carefully turned to the prone position on chest rolls.  The patient's suboccipital region was then shaved with clippers in this region as well as the posterior neck and upper thorax were  prepared with Betadine scrub and Betadine solution.  Sterile drapes were applied.  I then injected the area to be incised with Marcaine with epinephrine solution.  I used a scalpel to make a linear midline incision from C2-3 to C4-5.  We used electrocautery to perform a bilateral subperiosteal dissection exposing the proximal process and lamina of C2, C3 and C4.  We used electrocautery to expose the lateral masses at C2-3, C3-4 and C4-5.  The arthrodesis at C4-5 appear  solid.  He appeared to have a pseudoarthrosis at C3-4.  We inserted the [facility] retractor for exposure.  We confirmed intraoperative location with x-ray.  We began the decompression by using high-speed drill to drill away the manger of the seat to C3 and C4 spinous process.  We saved the bone for later use as autograft bone.  I then used the 2 mm Kerrison punch to complete the laminectomy at C2-3 and C3-4 removing the ligamentum flavum at these levels and decompressing the dura/spinal cord.  After we were satisfied with the decompression we now turned our attention to the posterior instrumentation.  We identified the center of the lateral masses at C2, C3 and C4.  Under fluoroscopic guidance I placed a 22 mm pars screw bilaterally at C2 and for 2 mm lateral mass screws at C3 and C4.  We got good bony purchase.  We then connected the unilateral screws with a lordotic rod.  We secured the rod in place with the caps which we tightened appropriately.  This completed the instrumentation at C2-3 and C3-4.  We now turned our attention to the posterior lateral arthrodesis.  We used a high-speed drill to decorticate the remainder of the C2-3 and C3-4 facets and lateral masses.  We laid bone dust we have obtained during the drilling and pieces of bone we obtained during the decompression as local autograft bone to complete the posterior lateral arthrodesis at C2-3 and C3-4 bilaterally.  We then obtain hemostasis with bipolar electrocautery.  We irrigated the wound out with bacitracin solution.  We remove the retractors.  We then injected the soft tissues with Exparel.  We then reapproximated the patient's cervical fascia  with interrupted 0 Vicryl suture.  We reapproximated the subcutaneous tissue with interrupted 2-0 Vicryl suture.  We reapproximate the skin with Steri-Strips and benzoin.  The wound was then coated with bacitracin ointment.  A sterile dressing was applied.  The drapes were removed.  He was then  returned to the supine position.  I removed the Mayfield three-point headrest from the patient's calvarium.  By report all sponge, instrument, and needle counts were correct at the end this case.

## 2019-11-05 NOTE — Progress Notes (Signed)
Orthopedic Tech Progress Note Patient Details:  [facility] 04-Mar-1945 [id number] Ordered pt collar Patient ID: [name], male   DOB: January 16, 1946, 74 y.o.   MRN: [id number]   [name] 11/05/2019, 2:06 PM

## 2019-11-05 NOTE — Progress Notes (Signed)
   Providing Compassionate, Quality Care - Together   Subjective: Patient reports no new numbness, tingling, or weakness.  Objective: Vital signs in last 24 hours: Temp:  [97.2 F (36.2 C)-97.9 F (36.6 C)] 97.2 F (36.2 C) (09/13 1300) Pulse Rate:  [83-102] 84 (09/13 1330) Resp:  [18-20] 20 (09/13 1330) BP: (101-128)/(56-64) 128/64 (09/13 1330) SpO2:  [98 %-100 %] 98 % (09/13 1330) Weight:  [84.4 kg] 84.4 kg (09/13 0800)  Intake/Output from previous day: No intake/output data recorded. Intake/Output this shift: Total I/O In: 1550 [I.V.:1000; IV Piggyback:550] Out: 150 [Urine:50; Blood:100]  Responds to voice and oriented x 4 PERRLA MAE, Strength and sensation grossly intact Incision is covered with Honeycomb dressing and Steri Strips; Dressing is clean, dry, and intact   Lab Results: No results for input(s): WBC, HGB, HCT, PLT in the last 72 hours. BMET No results for input(s): NA, K, CL, CO2, GLUCOSE, BUN, CREATININE, CALCIUM in the last 72 hours.  Studies/Results: No results found.  Assessment/Plan: Patient is recovering from anesthesia in the PACU following C2-3, C3-4 posterior cervical fusion. He is doing well and will be admitted to [facility] shortly.   LOS: 0 days    -Mobilize with therapies   Viona Gilmore, DNP, AGNP-C Nurse Practitioner  [facility] [place] [address], [place], [place], [place] [address] P: [phone]    F: [phone]  11/05/2019, 1:33 PM

## 2019-11-05 NOTE — H&P (Signed)
Subjective: The patient is a 74 year old white male whose had a previous anterior cervical discectomy, fusion and plating.  He initially did well but has developed recurrent neck pain, numbness, gait instability, etc.  He was worked up with a cervical MRI which demonstrated spinal stenosis at C2-3 and C3-4.  I discussed the various treatment options and recommend he consider surgery.  He has weighed the risk, benefits alternatives and decided proceed with a posterior cervical instrumentation, decompression and fusion.  Past Medical History:  Diagnosis Date  . Anxiety   . Cancer [facility])    Prostate and Basal cell on left nose  . Diabetes mellitus without complication ([place])   . GERD (gastroesophageal reflux disease)   . History of hiatal hernia   . Hypertension   . Hypothyroidism     Past Surgical History:  Procedure Laterality Date  . BACK SURGERY     x 5- first surgery 2006, 2008, 2009 x 2 and 2011  . BASAL CELL CARCINOMA EXCISION Left 09/05/2019   Nose  . BIOPSY PROSTATE  10/03/2019  . EYE SURGERY  2016   cataract surgery  . FRACTURE SURGERY  1991  . HERNIA REPAIR  1992  . LUMBAR LAMINECTOMY/DECOMPRESSION MICRODISCECTOMY N/A 12/28/2017   Procedure: LAMINECTOMY AND FORAMINOTOMY LUMBAR TWO- LUMBAR THREE, LUMBAR THREE- LUMBAR FOUR, LUMBAR FOUR- LUMBAR FIVE, RIGHT LUMBAR FOUR- LUMBAR FIVE DISKECTOMY;  Surgeon: Newman Pies, MD;  Location: [place];  Service: Neurosurgery;  Laterality: N/A;    Allergies  Allergen Reactions  . Codeine Nausea Only  . Morphine And Related Other (See Comments)    Alters heart rate    Social History   Tobacco Use  . Smoking status: Never Smoker  . Smokeless tobacco: Never Used  Substance Use Topics  . Alcohol use: Never    History reviewed. No pertinent family history. Prior to Admission medications   Medication Sig Start Date End Date Taking? Authorizing Provider  amLODipine (NORVASC) 10 MG tablet Take 10 mg by mouth daily.    Yes [provider]  aspirin EC 81 MG tablet Take 81 mg by mouth daily.   Yes [provider]  atorvastatin (LIPITOR) 80 MG tablet Take 80 mg by mouth daily.   Yes [provider]  carvedilol (COREG) 25 MG tablet Take 25 mg by mouth 2 (two) times daily. 10/25/17  Yes [provider]  Cholecalciferol (VITAMIN D3) 2000 units TABS Take 2,000 Units by mouth daily.   Yes [provider]  Cyanocobalamin (VITAMIN B-12 IJ) Inject 1,000 mcg as directed every 30 (thirty) days.   Yes [provider]  doxycycline (VIBRAMYCIN) 100 MG capsule Take 100 mg by mouth 2 (two) times daily. BID for 14 Day course 10/30/19 11/13/19 Yes [provider]  Exenatide ER (BYDUREON) 2 MG PEN Inject 2 mg into the skin every 7 (seven) days.   Yes [provider]  irbesartan (AVAPRO) 300 MG tablet Take 300 mg by mouth daily. 10/25/17  Yes [provider]  lactobacillus acidophilus (BACID) TABS tablet Take 1 tablet by mouth daily.   Yes [provider]  LANTUS SOLOSTAR 100 UNIT/ML Solostar Pen Inject 50 Units into the skin at bedtime.  12/15/17  Yes [provider]  levothyroxine (SYNTHROID, LEVOTHROID) 100 MCG tablet Take 100 mcg by mouth daily before breakfast. 12/04/15  Yes [provider]  metFORMIN (GLUCOPHAGE-XR) 500 MG 24 hr tablet Take 500-1,000 mg by mouth See admin instructions. Take 1000 mg by mouth in the morning and 500  mg in the evening 10/15/17  Yes [provider]  niacin 500 MG tablet Take 500 mg by mouth at bedtime.   Yes [provider]  omeprazole (PRILOSEC) 20 MG capsule Take 20 mg by mouth every other day.   Yes [provider]  potassium chloride SA (K-DUR,KLOR-CON) 20 MEQ tablet Take 20 mEq by mouth 2 (two) times daily.   Yes [provider]  sertraline (ZOLOFT) 100 MG tablet Take 100 mg by mouth at bedtime.   Yes [provider]  cyclobenzaprine (FLEXERIL) 10 MG tablet Take 1  tablet (10 mg total) by mouth 3 (three) times daily as needed for muscle spasms. Patient not taking: Reported on 10/24/2019 12/29/17   Newman Pies, MD  cycloSPORINE (RESTASIS) 0.05 % ophthalmic emulsion Place 1 drop into both eyes 2 (two) times daily. Patient not taking: Reported on 10/24/2019    [provider]  docusate sodium (COLACE) 100 MG capsule Take 1 capsule (100 mg total) by mouth 2 (two) times daily. Patient not taking: Reported on 10/24/2019 12/29/17   Newman Pies, MD  exenatide (BYETTA) 10 MCG/0.04ML SOPN injection Inject 10 mcg into the skin 2 (two) times daily with a meal. Patient not taking: Reported on 10/24/2019    [provider]  glimepiride (AMARYL) 4 MG tablet Take 2 mg by mouth daily with breakfast. Patient not taking: Reported on 10/24/2019    [provider]  HYDROmorphone (DILAUDID) 2 MG tablet Take 1 tablet (2 mg total) by mouth every 4 (four) hours as needed for moderate pain. Patient not taking: Reported on 10/24/2019 12/29/17   Newman Pies, MD  loratadine (CLARITIN) 10 MG tablet Take 10 mg by mouth daily as needed for allergies. Patient not taking: Reported on 10/24/2019    [provider]  Omega-3 Fatty Acids (FISH OIL) 1200 MG CAPS Take 3,600 mg by mouth daily.  Patient not taking: Reported on 10/24/2019    [provider]     Review of Systems  Positive ROS: As above  All other systems have been reviewed and were otherwise negative with the exception of those mentioned in the HPI and as above.  Objective: Vital signs in last 24 hours: Temp:  [97.9 F (36.6 C)] 97.9 F (36.6 C) (09/13 0800) Pulse Rate:  [102] 102 (09/13 0800) Resp:  [18] 18 (09/13 0800) BP: (101)/(56) 101/56 (09/13 0800) SpO2:  [100 %] 100 % (09/13 0800) Weight:  [84.4 kg] 84.4 kg (09/13 0800) Estimated body mass index is 29.13 kg/m as calculated from the following:   Height as of this encounter: 5\' 7"  (1.702 m).   Weight as of this  encounter: 84.4 kg.   General Appearance: Alert Head: Normocephalic, without obvious abnormality, atraumatic Eyes: PERRL, conjunctiva/corneas clear, EOM's intact,    Ears: Normal  Throat: Normal  Neck: The patient's anterior cervical incision is well-healed.  He has limited cervical range of motion. Back: unremarkable Lungs: Clear to auscultation bilaterally, respirations unlabored Heart: Regular rate and rhythm, no murmur, rub or gallop Abdomen: Soft, non-tender Extremities: Extremities normal, atraumatic, no cyanosis or edema Skin: unremarkable  NEUROLOGIC:   Mental status: alert and oriented,Motor Exam - grossly normal Sensory Exam - grossly normal Reflexes:  Coordination - grossly normal Gait -unsteady Balance - grossly normal Cranial Nerves: I: smell Not tested  II: visual acuity  OS: Normal  OD: Normal   II: visual fields Full to confrontation  II: pupils Equal, round, reactive to light  III,VII: ptosis None  III,IV,VI: extraocular  muscles  Full ROM  V: mastication Normal  V: facial light touch sensation  Normal  V,VII: corneal reflex  Present  VII: facial muscle function - upper  Normal  VII: facial muscle function - lower Normal  VIII: hearing Not tested  IX: soft palate elevation  Normal  IX,X: gag reflex Present  XI: trapezius strength  5/5  XI: sternocleidomastoid strength 5/5  XI: neck flexion strength  5/5  XII: tongue strength  Normal    Data Review Lab Results  Component Value Date   WBC 10.9 (H) 11/01/2019   HGB 12.2 (L) 11/01/2019   HCT 38.0 (L) 11/01/2019   MCV 89.6 11/01/2019   PLT 291 11/01/2019   Lab Results  Component Value Date   NA 133 (L) 11/01/2019   K 4.8 11/01/2019   CL 102 11/01/2019   CO2 21 (L) 11/01/2019   BUN 19 11/01/2019   CREATININE 1.24 11/01/2019   GLUCOSE 299 (H) 11/01/2019   Lab Results  Component Value Date   INR 0.97 10/15/2009    Assessment/Plan: Cervical spinal stenosis, cervical myelopathy, cervicalgia:  I have discussed the situation with the patient.  I reviewed his imaging studies with him and pointed out the abnormalities.  We have discussed the various treatment options including surgery.  I have described the surgical treatment option of a C2-3 and C3-4 laminectomy with posterior instrumentation and fusion.  I have shown him surgical models.  I have given him a surgical pamphlet.  We have discussed the risk, benefits, alternatives, expected postoperative course, and likelihood of achieving our goals with surgery.  I have answered all his questions.  He has decided to proceed with surgery.  Diabetes mellitus: Noted   Ophelia Charter 11/05/2019 9:14 AM

## 2019-11-05 NOTE — Anesthesia Postprocedure Evaluation (Signed)
Anesthesia Post Note  Patient: [name]  Procedure(s) Performed: LAMINECTOMY AND FORAMINOTOMY, INSTRUMENTATION/FUSION CERVICAL TWO CERVICAL THREE, CERVICAL THREE- CERVICAL FOUR (N/A )     Patient location during evaluation: PACU Anesthesia Type: General Level of consciousness: awake and alert, patient cooperative and oriented Pain management: pain level controlled Vital Signs Assessment: post-procedure vital signs reviewed and stable Respiratory status: spontaneous breathing, nonlabored ventilation, respiratory function stable and patient connected to nasal cannula oxygen Cardiovascular status: blood pressure returned to baseline and stable Postop Assessment: no apparent nausea or vomiting Anesthetic complications: no   No complications documented.  Last Vitals:  Vitals:   11/05/19 1330 11/05/19 1345  BP: 128/64 117/60  Pulse: 84 85  Resp: 20 17  Temp:    SpO2: 98% 91%    Last Pain:  Vitals:   11/05/19 1330  TempSrc:   PainSc: 0-No pain                 Keianna Signer,E. Necie Wilcoxson

## 2019-11-06 LAB — GLUCOSE, CAPILLARY: Glucose-Capillary: 242 mg/dL — ABNORMAL HIGH (ref 70–99)

## 2019-11-06 MED ORDER — CYCLOBENZAPRINE HCL 10 MG PO TABS: 10.0000 mg | ORAL_TABLET | Freq: Three times a day (TID) | ORAL | 0 refills | Status: DC | PRN

## 2019-11-06 MED ORDER — OXYCODONE HCL 5 MG PO TABS
5.0000 mg | ORAL_TABLET | Freq: Four times a day (QID) | ORAL | 0 refills | Status: DC | PRN
Start: 2019-11-06 — End: 2020-07-28

## 2019-11-06 NOTE — Discharge Summary (Signed)
Physician Discharge Summary     Providing Compassionate, Quality Care - Together   Patient ID: [name] MRN: [id number] DOB/AGE: 1945/06/10 74 y.o.  Admit date: 11/05/2019 Discharge date: 11/06/2019  Admission Diagnoses: Myelopathy concurrent with and due to spinal stenosis of cervical region   Discharge Diagnoses:  Active Problems:   Myelopathy concurrent with and due to spinal stenosis of cervical region [facility])   Discharged Condition: good  Hospital Course: Patient underwent a C2-3, C3-4 posterior lateral fusion by Dr. Arnoldo Morale on 11/05/2019. He was admitted to 3C08 following recovery from anesthesia in the PACU. His postoperative course has been uncomplicated. He is ambulating independently and without difficulty. He is tolerating a normal diet. He is not having any bowel or bladder dysfunction. His pain is well-controlled with oral pain medication. He is ready for discharge home.   Consults: None  Significant Diagnostic Studies: DG Cervical Spine 1 View  Result Date: 11/05/2019 CLINICAL DATA:  Cross-table lateral for cervical spinal fusion. EXAM: DG CERVICAL SPINE - 1 VIEW COMPARISON:  Same day intraoperative fluoroscopy imaged as well as cervical spine radiographs dated 11/13/2013. FINDINGS: Intraoperative imaging demonstrates surgical equipment posterior to the C3 vertebral body. Anterior cervical fixation plate and screws are redemonstrated with fracture of the superior screw. IMPRESSION: Intraoperative imaging during C3-C5 cervical fusion. Electronically Signed   By: Zerita Boers M.D.   On: 11/05/2019 19:16   DG Cervical Spine 2-3 Views  Result Date: 11/05/2019 CLINICAL DATA:  Posterior cervical fusion C2 through C4. EXAM: CERVICAL SPINE - 2-3 VIEW; DG C-ARM 1-60 MIN COMPARISON:  05/13/2007 FINDINGS: Single lateral C-arm image of the cervical spine is obtained in the operating room. Interval placement of posterior screws C2, C3, and C4 in satisfactory position. Posterior  rods not in place ACDF C3, C4 and C5. Anatomy not visualized below the C4-5 level. Fracture of both anterior C3 screws. IMPRESSION: Interval placement of pedicle screws at C3, C4, C5. Electronically Signed   By: Franchot Gallo M.D.   On: 11/05/2019 14:15   DG C-Arm 1-60 Min  Result Date: 11/05/2019 CLINICAL DATA:  Posterior cervical fusion C2 through C4. EXAM: CERVICAL SPINE - 2-3 VIEW; DG C-ARM 1-60 MIN COMPARISON:  05/13/2007 FINDINGS: Single lateral C-arm image of the cervical spine is obtained in the operating room. Interval placement of posterior screws C2, C3, and C4 in satisfactory position. Posterior rods not in place ACDF C3, C4 and C5. Anatomy not visualized below the C4-5 level. Fracture of both anterior C3 screws. IMPRESSION: Interval placement of pedicle screws at C3, C4, C5. Electronically Signed   By: Franchot Gallo M.D.   On: 11/05/2019 14:15     Treatments: Surgery: C2-3 and C3-4 laminectomy; C2-3 and C3-4 posterior lateral arthrodesis with local morselized autograft bone; C2-C4 posterior instrumentation with lateral mass screws and rods ([place])  Discharge Exam: Blood pressure 136/78, pulse 97, temperature 97.9 F (36.6 C), temperature source Oral, resp. rate 16, height 5\' 7"  (1.702 m), weight 84.4 kg, SpO2 98 %.   Per report: Alert and oriented x 4 PERRLA CN II-XII grossly intact MAE, Strength and sensation intact Incision is covered with Honeycomb dressing and Steri Strips; Dressing is clean, dry, and intact   Disposition: Discharge disposition: 01-Home or Self Care        Allergies as of 11/06/2019      Reactions   Codeine Nausea Only   Morphine And Related Other (See Comments)   Alters heart rate      Medication List  STOP taking these medications   cycloSPORINE 0.05 % ophthalmic emulsion Commonly known as: RESTASIS   docusate sodium 100 MG capsule Commonly known as: COLACE   Fish Oil 1200 MG Caps   glimepiride 4 MG tablet Commonly known as:  AMARYL   HYDROmorphone 2 MG tablet Commonly known as: DILAUDID   loratadine 10 MG tablet Commonly known as: CLARITIN     TAKE these medications   amLODipine 10 MG tablet Commonly known as: NORVASC Take 10 mg by mouth daily.   aspirin EC 81 MG tablet Take 81 mg by mouth daily.   atorvastatin 80 MG tablet Commonly known as: LIPITOR Take 80 mg by mouth daily.   Bydureon 2 MG Pen Generic drug: Exenatide ER Inject 2 mg into the skin every 7 (seven) days. What changed: Another medication with the same name was removed. Continue taking this medication, and follow the directions you see here.   carvedilol 25 MG tablet Commonly known as: COREG Take 25 mg by mouth 2 (two) times daily.   cyclobenzaprine 10 MG tablet Commonly known as: FLEXERIL Take 1 tablet (10 mg total) by mouth 3 (three) times daily as needed for muscle spasms.   doxycycline 100 MG capsule Commonly known as: VIBRAMYCIN Take 100 mg by mouth 2 (two) times daily. BID for 14 Day course   irbesartan 300 MG tablet Commonly known as: AVAPRO Take 300 mg by mouth daily.   lactobacillus acidophilus Tabs tablet Take 1 tablet by mouth daily.   Lantus SoloStar 100 UNIT/ML Solostar Pen Generic drug: insulin glargine Inject 50 Units into the skin at bedtime.   levothyroxine 100 MCG tablet Commonly known as: SYNTHROID Take 100 mcg by mouth daily before breakfast.   metFORMIN 500 MG 24 hr tablet Commonly known as: GLUCOPHAGE-XR Take 500-1,000 mg by mouth See admin instructions. Take 1000 mg by mouth in the morning and 500 mg in the evening   niacin 500 MG tablet Take 500 mg by mouth at bedtime.   omeprazole 20 MG capsule Commonly known as: PRILOSEC Take 20 mg by mouth every other day.   oxyCODONE 5 MG immediate release tablet Commonly known as: Oxy IR/ROXICODONE Take 1 tablet (5 mg total) by mouth every 6 (six) hours as needed for moderate pain or severe pain ((score 4 to 6)).   potassium chloride SA 20 MEQ  tablet Commonly known as: KLOR-CON Take 20 mEq by mouth 2 (two) times daily.   sertraline 100 MG tablet Commonly known as: ZOLOFT Take 100 mg by mouth at bedtime.   VITAMIN B-12 IJ Inject 1,000 mcg as directed every 30 (thirty) days.   Vitamin D3 50 MCG (2000 UT) Tabs Take 2,000 Units by mouth daily.       Follow-up Information    Newman Pies, MD. Schedule an appointment as soon as possible for a visit in 2 week(s).   Specialty: Neurosurgery Contact information: [address] [place] [address] [place] [address] [phone]               Signed: Patricia Nettle 11/06/2019, 8:27 AM

## 2019-11-06 NOTE — Evaluation (Signed)
Physical Therapy Evaluation Patient Details Name: [name] MRN: [id number] DOB: [birth year] Today's Date: 11/06/2019   History of Present Illness  74 yo male s/p PLF C2-3 C3-4 PMH anxiety CA DM HTN back surgery x6   Clinical Impression  Pt admitted with above diagnosis. At the time of PT eval, pt was able to demonstrate transfers and ambulation with up to occasional min assist for balance support and safety and RW for support. Pt was educated on precautions, brace application/wearing schedule, appropriate activity progression, and car transfer. Pt currently with functional limitations due to the deficits listed below (see PT Problem List). Pt will benefit from skilled PT to increase their independence and safety with mobility to allow discharge to the venue listed below.      Follow Up Recommendations No PT follow up;Supervision for mobility/OOB    Equipment Recommendations       Recommendations for Other Services       Precautions / Restrictions Precautions Precautions: Cervical;Fall Precaution Booklet Issued: Yes (comment) Precaution Comments: handout provided and reviewed during functional mobility.  Required Braces or Orthoses: Cervical Brace Cervical Brace: Hard collar;At all times Restrictions Weight Bearing Restrictions: No      Mobility  Bed Mobility               General bed mobility comments: at eob on arrival putting shoes on  Transfers Overall transfer level: Needs assistance Equipment used: Rolling walker (2 wheeled) Transfers: Sit to/from Stand Sit to Stand: Supervision         General transfer comment: VC's for hand placement on seated surface for safety. Pt wanting hands on walker for power-up to full stand but was able to do so without tipping walker back.   Ambulation/Gait Ambulation/Gait assistance: Min guard;Min assist Gait Distance (Feet): 250 Feet Assistive device: Rolling walker (2 wheeled) Gait Pattern/deviations: Step-through  pattern;Decreased stride length;Trunk flexed Gait velocity: Decreased Gait velocity interpretation: <1.31 ft/sec, indicative of household ambulator General Gait Details: Grossly requires close guard in hall with RW for support, however has difficulty negotiating walker around tight spaces. Min assist provided at times in the room.   Stairs Stairs: Yes Stairs assistance: Min guard Stair Management: One rail Right;Step to pattern;Forwards Number of Stairs: 3 General stair comments: VC's for sequencing and general safety. Of note, pt took a large step initially and completely missed the step and stumbled. He was able to correct his balance without therapist assst, however close guard provided after.   Wheelchair Mobility    Modified Rankin (Stroke Patients Only)       Balance Overall balance assessment: Needs assistance Sitting-balance support: Feet supported;No upper extremity supported Sitting balance-Leahy Scale: Fair     Standing balance support: Single extremity supported;During functional activity Standing balance-Leahy Scale: Poor Standing balance comment: reliant on UB support                             Pertinent Vitals/Pain Pain Assessment: No/denies pain    Home Living Family/patient expects to be discharged to:: Private residence Living Arrangements: Spouse/significant other Available Help at Discharge: Family Type of Home: House Home Access: Stairs to enter Entrance Stairs-Rails: Right Entrance Stairs-Number of Steps: 2 Home Layout: One level Home Equipment: None Additional Comments: wife can (A) at D/c 24/7    Prior Function Level of Independence: Independent         Comments: no animals     Hand Dominance   Dominant Hand: Right  Extremity/Trunk Assessment   Upper Extremity Assessment Upper Extremity Assessment: Defer to OT evaluation RUE Deficits / Details: reports numbness of finger tip for digits 1, 2, and part of 3 only. pt  with 5 out 5 strength for grasp RUE Sensation: decreased light touch LUE Deficits / Details: reports numbness of finger tip for digits 1, 2, and part of 3 only. pt with 5 out 5 strength for grasp LUE Sensation: decreased light touch    Lower Extremity Assessment Lower Extremity Assessment: Generalized weakness    Cervical / Trunk Assessment Cervical / Trunk Assessment: Kyphotic  Communication   Communication: No difficulties  Cognition Arousal/Alertness: Awake/alert Behavior During Therapy: WFL for tasks assessed/performed Overall Cognitive Status: Within Functional Limits for tasks assessed                                        General Comments General comments (skin integrity, edema, etc.): dressing dry and intact currenlty    Exercises     Assessment/Plan    PT Assessment Patient needs continued PT services  PT Problem List Decreased strength;Decreased activity tolerance;Decreased balance;Decreased mobility;Decreased knowledge of use of DME;Decreased safety awareness;Decreased knowledge of precautions;Pain       PT Treatment Interventions DME instruction;Gait training;Stair training;Functional mobility training;Therapeutic activities;Therapeutic exercise;Neuromuscular re-education;Patient/family education    PT Goals (Current goals can be found in the Care Plan section)  Acute Rehab PT Goals Patient Stated Goal: to go home PT Goal Formulation: With patient Time For Goal Achievement: 11/13/19 Potential to Achieve Goals: Good    Frequency Min 5X/week   Barriers to discharge        Co-evaluation               AM-PAC PT "6 Clicks" Mobility  Outcome Measure Help needed turning from your back to your side while in a flat bed without using bedrails?: None Help needed moving from lying on your back to sitting on the side of a flat bed without using bedrails?: None Help needed moving to and from a bed to a chair (including a wheelchair)?:  None Help needed standing up from a chair using your arms (e.g., wheelchair or bedside chair)?: None Help needed to walk in hospital room?: None Help needed climbing 3-5 steps with a railing? : A Little 6 Click Score: 23    End of Session Equipment Utilized During Treatment: Gait belt;Cervical collar Activity Tolerance: Patient tolerated treatment well Patient left: in chair;with call bell/phone within reach;with family/visitor present Nurse Communication: Mobility status PT Visit Diagnosis: Unsteadiness on feet (R26.81);Pain Pain - part of body:  (neck)    Time: 3559-7416 PT Time Calculation (min) (ACUTE ONLY): 25 min   Charges:   PT Evaluation $PT Eval Low Complexity: 1 Low PT Treatments $Gait Training: 8-22 mins        Rolinda Roan, PT, DPT Acute Rehabilitation Services Pager: [phone] Office: [phone]   Thelma Comp 11/06/2019, 12:31 PM

## 2019-11-06 NOTE — Discharge Instructions (Signed)
Wound Care Keep incision covered and dry for two days.    Do not put any creams, lotions, or ointments on incision. Leave steri-strips on back.  They will fall off by themselves.  Activity Walk each and every day, increasing distance each day. No lifting greater than 5 lbs.  Avoid excessive neck motion. No driving for 2 weeks; may ride as a passenger locally.  Diet Resume your normal diet.   Return to Work Will be discussed at your follow up appointment.  Call Your Doctor If Any of These Occur Redness, drainage, or swelling at the wound.  Temperature greater than 101 degrees. Severe pain not relieved by pain medication. Incision starts to come apart.  Follow Up Appt Call today for appointment in 1-2 weeks ([phone]) or for problems.  If you have any hardware placed in your spine, you will need an x-ray before your appointment.  

## 2019-11-06 NOTE — Progress Notes (Signed)
Pt doing well. Pt and wife given D/C instructions with verbal understanding. Rx's were e-prescribed by MD. Pt's incision is clean and dry with no sign of infection. Pt's IV was removed prior to D/C. Pt D/C'd home via wheelchair per MD order. Pt is stable @ D/C and has no other needs at this time. Holli Humbles, RN

## 2019-11-06 NOTE — Evaluation (Signed)
Occupational Therapy Evaluation Patient Details Name: [name] MRN: [id number] DOB: [birth year] Today's Date: 11/06/2019    History of Present Illness 74 yo male s/p PLF C2-3 C3-4 PMH anxiety CA DM HTN back surgery x6    Clinical Impression   Patient evaluated by Occupational Therapy with no further acute OT needs identified. All education has been completed and the patient has no further questions. See below for any follow-up Occupational Therapy or equipment needs. OT to sign off. Thank you for referral.      Follow Up Recommendations  No OT follow up    Equipment Recommendations  None recommended by OT    Recommendations for Other Services       Precautions / Restrictions Precautions Precautions: Cervical Precaution Booklet Issued: Yes (comment) Precaution Comments: handout provided and reviewed for adls Required Braces or Orthoses: Cervical Brace Cervical Brace: Hard collar;At all times Restrictions Weight Bearing Restrictions: No      Mobility Bed Mobility               General bed mobility comments: at eob on arrival sitting statically  Transfers Overall transfer level: Needs assistance Equipment used: Rolling walker (2 wheeled) Transfers: Sit to/from Stand Sit to Stand: Min guard         General transfer comment: pt does requires min (A) with narrowed spaces with RW and instrcutions on RW placement for safety    Balance Overall balance assessment: Needs assistance         Standing balance support: Single extremity supported;During functional activity Standing balance-Leahy Scale: Fair Standing balance comment: reliant on UB support                           ADL either performed or assessed with clinical judgement   ADL Overall ADL's : Modified independent                                       General ADL Comments: able to dress himself. educated on cervical collar and positioning. pt with change of pad  s and educated on use.    Cervical precautions ( handout provided): Educated patient on don doff brace with return demonstration, educated on oral care using cups, washing face with cloth, never to wash directly on incision site, avoid neck rotation flexion and extension, positioning with pillows in chair for bil UE, sleeping positioning, avoiding pushing / pulling with bil UE, .  Pt educated on need to notify doctor / RN of swallowing changes or choking..    Vision Baseline Vision/History: Wears glasses Wears Glasses: At all times       Perception     Praxis      Pertinent Vitals/Pain Pain Assessment: No/denies pain     Hand Dominance Right   Extremity/Trunk Assessment Upper Extremity Assessment Upper Extremity Assessment: RUE deficits/detail;LUE deficits/detail RUE Deficits / Details: reports numbness of finger tip for digits 1, 2, and part of 3 only. pt with 5 out 5 strength for grasp RUE Sensation: decreased light touch LUE Deficits / Details: reports numbness of finger tip for digits 1, 2, and part of 3 only. pt with 5 out 5 strength for grasp LUE Sensation: decreased light touch   Lower Extremity Assessment Lower Extremity Assessment: Defer to PT evaluation   Cervical / Trunk Assessment Cervical / Trunk Assessment: Kyphotic   Communication Communication Communication:  No difficulties   Cognition Arousal/Alertness: Awake/alert Behavior During Therapy: WFL for tasks assessed/performed Overall Cognitive Status: Within Functional Limits for tasks assessed                                     General Comments  dressing dry and intact currenlty    Exercises     Shoulder Instructions      Home Living Family/patient expects to be discharged to:: Private residence Living Arrangements: Spouse/significant other Available Help at Discharge: Family Type of Home: House Home Access: Stairs to enter Technical brewer of Steps: 2 Entrance Stairs-Rails:  Right Home Layout: One level     Bathroom Shower/Tub: Occupational psychologist: Standard     Home Equipment: None   Additional Comments: wife can (A) at D/c 24/7      Prior Functioning/Environment Level of Independence: Independent        Comments: no animals        OT Problem List:        OT Treatment/Interventions:      OT Goals(Current goals can be found in the care plan section) Acute Rehab OT Goals Patient Stated Goal: to go home OT Goal Formulation: With patient  OT Frequency:     Barriers to D/C:            Co-evaluation              AM-PAC OT "6 Clicks" Daily Activity     Outcome Measure Help from another person eating meals?: None Help from another person taking care of personal grooming?: None Help from another person toileting, which includes using toliet, bedpan, or urinal?: None Help from another person bathing (including washing, rinsing, drying)?: A Little Help from another person to put on and taking off regular upper body clothing?: None Help from another person to put on and taking off regular lower body clothing?: A Little 6 Click Score: 22   End of Session Equipment Utilized During Treatment: Rolling walker;Cervical collar Nurse Communication: Mobility status;Precautions  Activity Tolerance: Patient tolerated treatment well Patient left: in chair;with call bell/phone within reach  OT Visit Diagnosis: Unsteadiness on feet (R26.81)                Time: 8850-2774 OT Time Calculation (min): 14 min Charges:  OT General Charges $OT Visit: 1 Visit OT Evaluation $OT Eval Moderate Complexity: 1 Mod   Brynn, OTR/L  Acute Rehabilitation Services Pager: [phone] Office: [phone] .   Jeri Modena 11/06/2019, 10:39 AM

## 2019-11-07 ENCOUNTER — Encounter (HOSPITAL_COMMUNITY): Payer: Self-pay | Admitting: Neurosurgery

## 2019-11-12 ENCOUNTER — Other Ambulatory Visit: Payer: Self-pay

## 2019-11-12 NOTE — Patient Outreach (Signed)
[facility]) Care Management  11/12/2019  [place] [birth year] [id number]    EMMI-General Discharge RED ON EMMI ALERT Day # 4 Date: 11/11/19 Red Alert Reason: "Lost interest in things? Yes"   Outreach attempt #1 to patient. Spoke with patient who denies any acute issues or concerns at present.  Reviewed and addressed red alert. Patient reports error in response-did not understand question. He continues to recover from surgery and has supportive spouse to assist him as needed. He states that pain is controlled. He has pain meds in the home but has not needed to take much of them. He has MD follow up appt in place. Patient confirms he has all his meds in the home and no issues or concerns regarding them. Patient has completed post discharge automated EMMI calls and denies any RN CM needs or concerns at this time.        Plan: RN CM will close case at this time.   Enzo Montgomery, RN,BSN,CCM [facility] Telephonic Care Management Coordinator Direct Phone: [phone] Toll Free: [phone] Fax: [phone]

## 2019-11-19 DIAGNOSIS — E538 Deficiency of other specified B group vitamins: Secondary | ICD-10-CM | POA: Diagnosis not present

## 2019-11-27 DIAGNOSIS — M4802 Spinal stenosis, cervical region: Secondary | ICD-10-CM | POA: Diagnosis not present

## 2019-11-27 DIAGNOSIS — S129XXD Fracture of neck, unspecified, subsequent encounter: Secondary | ICD-10-CM | POA: Diagnosis not present

## 2019-12-20 DIAGNOSIS — D510 Vitamin B12 deficiency anemia due to intrinsic factor deficiency: Secondary | ICD-10-CM | POA: Diagnosis not present

## 2020-01-09 DIAGNOSIS — E1142 Type 2 diabetes mellitus with diabetic polyneuropathy: Secondary | ICD-10-CM | POA: Diagnosis not present

## 2020-01-21 DIAGNOSIS — E538 Deficiency of other specified B group vitamins: Secondary | ICD-10-CM | POA: Diagnosis not present

## 2020-02-05 DIAGNOSIS — Z20822 Contact with and (suspected) exposure to covid-19: Secondary | ICD-10-CM | POA: Diagnosis not present

## 2020-02-11 DIAGNOSIS — Z20828 Contact with and (suspected) exposure to other viral communicable diseases: Secondary | ICD-10-CM | POA: Diagnosis not present

## 2020-02-14 DIAGNOSIS — J329 Chronic sinusitis, unspecified: Secondary | ICD-10-CM | POA: Diagnosis not present

## 2020-02-14 DIAGNOSIS — J40 Bronchitis, not specified as acute or chronic: Secondary | ICD-10-CM | POA: Diagnosis not present

## 2020-02-19 DIAGNOSIS — D510 Vitamin B12 deficiency anemia due to intrinsic factor deficiency: Secondary | ICD-10-CM | POA: Diagnosis not present

## 2020-02-25 DIAGNOSIS — H524 Presbyopia: Secondary | ICD-10-CM | POA: Diagnosis not present

## 2020-02-25 DIAGNOSIS — Z961 Presence of intraocular lens: Secondary | ICD-10-CM | POA: Diagnosis not present

## 2020-02-25 DIAGNOSIS — H04123 Dry eye syndrome of bilateral lacrimal glands: Secondary | ICD-10-CM | POA: Diagnosis not present

## 2020-02-25 DIAGNOSIS — E119 Type 2 diabetes mellitus without complications: Secondary | ICD-10-CM | POA: Diagnosis not present

## 2020-02-25 DIAGNOSIS — Z794 Long term (current) use of insulin: Secondary | ICD-10-CM | POA: Diagnosis not present

## 2020-03-20 DIAGNOSIS — E538 Deficiency of other specified B group vitamins: Secondary | ICD-10-CM | POA: Diagnosis not present

## 2020-03-21 DIAGNOSIS — M4802 Spinal stenosis, cervical region: Secondary | ICD-10-CM | POA: Diagnosis not present

## 2020-03-26 DIAGNOSIS — C61 Malignant neoplasm of prostate: Secondary | ICD-10-CM | POA: Diagnosis not present

## 2020-03-31 DIAGNOSIS — E039 Hypothyroidism, unspecified: Secondary | ICD-10-CM | POA: Diagnosis not present

## 2020-03-31 DIAGNOSIS — E782 Mixed hyperlipidemia: Secondary | ICD-10-CM | POA: Diagnosis not present

## 2020-03-31 DIAGNOSIS — D510 Vitamin B12 deficiency anemia due to intrinsic factor deficiency: Secondary | ICD-10-CM | POA: Diagnosis not present

## 2020-03-31 DIAGNOSIS — E559 Vitamin D deficiency, unspecified: Secondary | ICD-10-CM | POA: Diagnosis not present

## 2020-03-31 DIAGNOSIS — I10 Essential (primary) hypertension: Secondary | ICD-10-CM | POA: Diagnosis not present

## 2020-03-31 DIAGNOSIS — E538 Deficiency of other specified B group vitamins: Secondary | ICD-10-CM | POA: Diagnosis not present

## 2020-03-31 DIAGNOSIS — Z0000 Encounter for general adult medical examination without abnormal findings: Secondary | ICD-10-CM | POA: Diagnosis not present

## 2020-03-31 DIAGNOSIS — F324 Major depressive disorder, single episode, in partial remission: Secondary | ICD-10-CM | POA: Diagnosis not present

## 2020-03-31 DIAGNOSIS — E1140 Type 2 diabetes mellitus with diabetic neuropathy, unspecified: Secondary | ICD-10-CM | POA: Diagnosis not present

## 2020-04-01 DIAGNOSIS — R2689 Other abnormalities of gait and mobility: Secondary | ICD-10-CM | POA: Diagnosis not present

## 2020-04-01 DIAGNOSIS — E1140 Type 2 diabetes mellitus with diabetic neuropathy, unspecified: Secondary | ICD-10-CM | POA: Diagnosis not present

## 2020-04-02 DIAGNOSIS — C61 Malignant neoplasm of prostate: Secondary | ICD-10-CM | POA: Diagnosis not present

## 2020-04-02 DIAGNOSIS — N400 Enlarged prostate without lower urinary tract symptoms: Secondary | ICD-10-CM | POA: Diagnosis not present

## 2020-04-18 DIAGNOSIS — E538 Deficiency of other specified B group vitamins: Secondary | ICD-10-CM | POA: Diagnosis not present

## 2020-04-29 ENCOUNTER — Encounter: Payer: Self-pay | Admitting: Neurology

## 2020-05-21 DIAGNOSIS — D510 Vitamin B12 deficiency anemia due to intrinsic factor deficiency: Secondary | ICD-10-CM | POA: Diagnosis not present

## 2020-05-29 DIAGNOSIS — D225 Melanocytic nevi of trunk: Secondary | ICD-10-CM | POA: Diagnosis not present

## 2020-05-29 DIAGNOSIS — Z85828 Personal history of other malignant neoplasm of skin: Secondary | ICD-10-CM | POA: Diagnosis not present

## 2020-05-29 DIAGNOSIS — L905 Scar conditions and fibrosis of skin: Secondary | ICD-10-CM | POA: Diagnosis not present

## 2020-05-29 DIAGNOSIS — L853 Xerosis cutis: Secondary | ICD-10-CM | POA: Diagnosis not present

## 2020-05-29 DIAGNOSIS — L738 Other specified follicular disorders: Secondary | ICD-10-CM | POA: Diagnosis not present

## 2020-05-29 DIAGNOSIS — L821 Other seborrheic keratosis: Secondary | ICD-10-CM | POA: Diagnosis not present

## 2020-05-29 DIAGNOSIS — L814 Other melanin hyperpigmentation: Secondary | ICD-10-CM | POA: Diagnosis not present

## 2020-06-02 DIAGNOSIS — B351 Tinea unguium: Secondary | ICD-10-CM | POA: Diagnosis not present

## 2020-06-19 DIAGNOSIS — E538 Deficiency of other specified B group vitamins: Secondary | ICD-10-CM | POA: Diagnosis not present

## 2020-07-01 DIAGNOSIS — R2689 Other abnormalities of gait and mobility: Secondary | ICD-10-CM | POA: Diagnosis not present

## 2020-07-01 DIAGNOSIS — E1140 Type 2 diabetes mellitus with diabetic neuropathy, unspecified: Secondary | ICD-10-CM | POA: Diagnosis not present

## 2020-07-11 DIAGNOSIS — M4802 Spinal stenosis, cervical region: Secondary | ICD-10-CM | POA: Diagnosis not present

## 2020-07-11 DIAGNOSIS — G8929 Other chronic pain: Secondary | ICD-10-CM | POA: Diagnosis not present

## 2020-07-11 DIAGNOSIS — M545 Low back pain, unspecified: Secondary | ICD-10-CM | POA: Diagnosis not present

## 2020-07-22 DIAGNOSIS — E538 Deficiency of other specified B group vitamins: Secondary | ICD-10-CM | POA: Diagnosis not present

## 2020-07-28 ENCOUNTER — Encounter: Payer: Self-pay | Admitting: Neurology

## 2020-07-28 ENCOUNTER — Ambulatory Visit: Payer: Medicare PPO | Admitting: Neurology

## 2020-07-28 ENCOUNTER — Other Ambulatory Visit: Payer: Self-pay

## 2020-07-28 VITALS — BP 127/77 | HR 93 | Ht 68.0 in | Wt 181.0 lb

## 2020-07-28 DIAGNOSIS — M21371 Foot drop, right foot: Secondary | ICD-10-CM | POA: Diagnosis not present

## 2020-07-28 DIAGNOSIS — M48061 Spinal stenosis, lumbar region without neurogenic claudication: Secondary | ICD-10-CM | POA: Diagnosis not present

## 2020-07-28 DIAGNOSIS — G959 Disease of spinal cord, unspecified: Secondary | ICD-10-CM

## 2020-07-28 DIAGNOSIS — Z794 Long term (current) use of insulin: Secondary | ICD-10-CM | POA: Diagnosis not present

## 2020-07-28 DIAGNOSIS — E1140 Type 2 diabetes mellitus with diabetic neuropathy, unspecified: Secondary | ICD-10-CM | POA: Diagnosis not present

## 2020-07-28 NOTE — Patient Instructions (Addendum)
Nerve testing of the legs  Start physical therapy for balance training  We will request records from Dr. Adline Mango office  [place] (EMG/NCS) INSTRUCTIONS  How to Prepare The neurologist conducting the EMG will need to know if you have certain medical conditions. Tell the neurologist and other EMG lab personnel if you: . Have a pacemaker or any other electrical medical device . Take blood-thinning medications . Have hemophilia, a blood-clotting disorder that causes prolonged bleeding Bathing Take a shower or bath shortly before your exam in order to remove oils from your skin. Don't apply lotions or creams before the exam.  What to Expect You'll likely be asked to change into a hospital gown for the procedure and lie down on an examination table. The following explanations can help you understand what will happen during the exam.  . Electrodes. The neurologist or a technician places surface electrodes at various locations on your skin depending on where you're experiencing symptoms. Or the neurologist may insert needle electrodes at different sites depending on your symptoms.  . Sensations. The electrodes will at times transmit a tiny electrical current that you may feel as a twinge or spasm. The needle electrode may cause discomfort or pain that usually ends shortly after the needle is removed. If you are concerned about discomfort or pain, you may want to talk to the neurologist about taking a short break during the exam.  . Instructions. During the needle EMG, the neurologist will assess whether there is any spontaneous electrical activity when the muscle is at rest - activity that isn't present in healthy muscle tissue - and the degree of activity when you slightly contract the muscle.  He or she will give you instructions on resting and contracting a muscle at appropriate times. Depending on what muscles and nerves the neurologist is examining, he or she  may ask you to change positions during the exam.  After your EMG You may experience some temporary, minor bruising where the needle electrode was inserted into your muscle. This bruising should fade within several days. If it persists, contact your primary care doctor.

## 2020-07-28 NOTE — Progress Notes (Signed)
[facility] Clinic Note - Initial Visit   Date: 07/28/20  [name] MRN: [id number] DOB: [birth year]   Dear Dr. Harrington Challenger:  Thank you for your kind referral of [name] for consultation of imbalance. Although his history is well known to you, please allow [place] to reiterate it for the purpose of our medical record. The patient was accompanied to the clinic by self.   History of Present Illness: [name] is a 75 y.o. right-handed male with diabetes mellitus, hypothyroidism, hypertension, prostate cancer, s/p lumbar surgery x 5, s/p cervical surgery, and GERD presenting for evaluation of imbalance.  Starting around 2019, he began having imbalance and difficulty walking.  He has been using a walker for the past 6 months.  He has fallen twice over the past year.  He has tingling in the soles of the feet.  He also has weakness in the feet, especially the right foot.  He has multiple back surgeries, the last in 2019 by Dr. Arnoldo Morale, after MRI showed right L2-3 and L3-4 spinal stenosis, right L4-5 herniated disc.  In 2021, he underwent C2-3 and C3-4 laminectomy and fusion for cervical myelopathy.   He is retired from [employer].  Lives in one-level home with his wife.   Out-side paper records, electronic medical record, and images have been reviewed where available and summarized as:  MRI lumbar spine 12/03/2017: The dominant finding is at L4-5 where a central and rightward extrusion is observed with posterior element hypertrophy. Severe, near critical stenosis. RIGHT greater than LEFT L4 and L5 neural impingement. Mild to moderate stenosis at L2-L3, with moderate to severe stenosis at L3-4, as described. Potentially symptomatic LEFT-sided neural impingement due to large disc extrusion L1-L2.    Lab Results  Component Value Date   HGBA1C 7.8 (H) 11/01/2019   No results found for: VITAMINB12 No results found for: TSH No results found for:  ESRSEDRATE, POCTSEDRATE  Past Medical History:  Diagnosis Date  . Anxiety   . Cancer [facility])    Prostate and Basal cell on left nose  . Diabetes mellitus without complication ([place])   . GERD (gastroesophageal reflux disease)   . History of hiatal hernia   . Hypertension   . Hypothyroidism     Past Surgical History:  Procedure Laterality Date  . BACK SURGERY     x 5- first surgery 2006, 2008, 2009 x 2 and 2011  . BASAL CELL CARCINOMA EXCISION Left 09/05/2019   Nose  . BIOPSY PROSTATE  10/03/2019  . EYE SURGERY  2016   cataract surgery  . FRACTURE SURGERY  1991  . HERNIA REPAIR  1992  . LUMBAR LAMINECTOMY/DECOMPRESSION MICRODISCECTOMY N/A 12/28/2017   Procedure: LAMINECTOMY AND FORAMINOTOMY LUMBAR TWO- LUMBAR THREE, LUMBAR THREE- LUMBAR FOUR, LUMBAR FOUR- LUMBAR FIVE, RIGHT LUMBAR FOUR- LUMBAR FIVE DISKECTOMY;  Surgeon: Newman Pies, MD;  Location: [place];  Service: Neurosurgery;  Laterality: N/A;  . POSTERIOR CERVICAL FUSION/FORAMINOTOMY N/A 11/05/2019   Procedure: LAMINECTOMY AND FORAMINOTOMY, INSTRUMENTATION/FUSION CERVICAL TWO CERVICAL THREE, CERVICAL THREE- CERVICAL FOUR;  Surgeon: Newman Pies, MD;  Location: [place];  Service: Neurosurgery;  Laterality: N/A;     Medications:  Outpatient Encounter Medications as of 07/28/2020  Medication Sig  . amLODipine (NORVASC) 10 MG tablet Take 10 mg by mouth daily.   Marland Kitchen aspirin EC 81 MG tablet Take 81 mg by mouth daily.  Marland Kitchen atorvastatin (LIPITOR) 80 MG tablet Take 80 mg by mouth daily.  . carvedilol (COREG) 25 MG tablet Take  25 mg by mouth 2 (two) times daily.  . Cholecalciferol (VITAMIN D3) 2000 units TABS Take 2,000 Units by mouth daily.  . Cyanocobalamin (VITAMIN B-12 IJ) Inject 1,000 mcg as directed every 30 (thirty) days.  . Exenatide ER (BYDUREON) 2 MG PEN Inject 2 mg into the skin every 7 (seven) days.  . irbesartan (AVAPRO) 300 MG tablet Take 300 mg by mouth daily.  Marland Kitchen lactobacillus acidophilus (BACID) TABS tablet Take 1 tablet  by mouth daily.  Marland Kitchen LANTUS SOLOSTAR 100 UNIT/ML Solostar Pen Inject 50 Units into the skin at bedtime.   Marland Kitchen levothyroxine (SYNTHROID, LEVOTHROID) 100 MCG tablet Take 100 mcg by mouth daily before breakfast.  . metFORMIN (GLUCOPHAGE-XR) 500 MG 24 hr tablet Take 500-1,000 mg by mouth See admin instructions. Take 1000 mg by mouth in the morning and 500 mg in the evening  . niacin 500 MG tablet Take 500 mg by mouth at bedtime.  Marland Kitchen omeprazole (PRILOSEC) 20 MG capsule Take 20 mg by mouth every other day.  . potassium chloride SA (K-DUR,KLOR-CON) 20 MEQ tablet Take 20 mEq by mouth 2 (two) times daily.  . sertraline (ZOLOFT) 100 MG tablet Take 100 mg by mouth at bedtime.  . [DISCONTINUED] cyclobenzaprine (FLEXERIL) 10 MG tablet Take 1 tablet (10 mg total) by mouth 3 (three) times daily as needed for muscle spasms.  . [DISCONTINUED] oxyCODONE (OXY IR/ROXICODONE) 5 MG immediate release tablet Take 1 tablet (5 mg total) by mouth every 6 (six) hours as needed for moderate pain or severe pain ((score 4 to 6)).   No facility-administered encounter medications on file as of 07/28/2020.    Allergies:  Allergies  Allergen Reactions  . Codeine Nausea Only  . Morphine And Related Other (See Comments)    Alters heart rate    Family History: Family History  Problem Relation Age of Onset  . Cancer Mother   . Stroke Father     Social History: Social History   Tobacco Use  . Smoking status: Never Smoker  . Smokeless tobacco: Never Used  Vaping Use  . Vaping Use: Never used  Substance Use Topics  . Alcohol use: Never  . Drug use: Never   Social History   Social History Narrative   Living with wife   Drinking coffee occasion   Right handed    Vital Signs:  BP 127/77   Pulse 93   Ht 5\' 8"  (1.727 m)   Wt 181 lb (82.1 kg)   SpO2 96%   BMI 27.52 kg/m   Neurological Exam: MENTAL STATUS including orientation to time, place, person, recent and remote memory, attention span and concentration,  language, and fund of knowledge is normal.  Speech is not dysarthric.  CRANIAL NERVES: II:  No visual field defects.     III-IV-VI: Pupils equal round and reactive to light.  Normal conjugate, extra-ocular eye movements in all directions of gaze.  No nystagmus.  No ptosis.   V:  Normal facial sensation.    VII:  Normal facial symmetry and movements.   VIII:  Normal hearing and vestibular function.   IX-X:  Normal palatal movement.   XI:  Normal shoulder shrug and head rotation.   XII:  Normal tongue strength and range of motion, no deviation or fasciculation.  MOTOR:  No atrophy, fasciculations or abnormal movements.  No pronator drift.   Upper Extremity:  Right  Left  Deltoid  5/5   5/5   Biceps  5/5   5/5   Triceps  5/5   5/5   Infraspinatus 5/5  5/5  Medial pectoralis 5/5  5/5  Wrist extensors  5/5   5/5   Wrist flexors  5/5   5/5   Finger extensors  5/5   5/5   Finger flexors  5/5   5/5   Dorsal interossei  5/5   5/5   Abductor pollicis  5/5   5/5   Tone (Ashworth scale)  0  0   Lower Extremity:  Right  Left  Hip flexors  5/5   5/5   Hip extensors  5/5   5/5   Adductor 5/5  5/5  Abductor 5/5  5/5  Knee flexors  5/5   5/5   Knee extensors  5/5   5/5   Dorsiflexors  3/5   5/5   Plantarflexors  3/5   4/5   Toe extensors  3/5   3/5   Toe flexors  3/5   3/5   Tone (Ashworth scale)  0  0   MSRs:  Right        Left                  brachioradialis 2+  2+  biceps 2+  2+  triceps 2+  2+  patellar 0  2+  ankle jerk 0  0  Hoffman no  no  plantar response down  down   SENSORY:  Vibration is absent below the ankles, temperature and pin prick is reduced at the feet, worse over the right leg.  Romberg's sign absent.   COORDINATION/GAIT: Normal finger-to- nose-finger.  Gait very wide-based, unsteady, requiring one-person assist.  Gait markedly improved with walker.   IMPRESSION: This is a 75 year-old man with diabetes mellitus and history of cervical surgery x 2 and  lumbar surgery x 5 referred for evaluation of unsteady gait.  His exam shows right foot weakness, distal neuropathy, and severe ataxia.  His unsteadiness is most likely multifactorial related to neuropathy, lumbosacral degenerative disease. I do not appreciate any findings to suggest CNS pathology for his ataxia.  He will return for NCS/EMG of the legs.  I will also request Dr. Adline Mango office notes to better understand his spine issues.  Finally, he will start PT for balance training.  Patient educated on daily foot inspection, fall prevention, and safety precautions around the home.  Further recommendations pending results.   Thank you for allowing me to participate in patient's care.  If I can answer any additional questions, I would be pleased to do so.    Sincerely,    Navdeep Halt K. Posey Pronto, DO

## 2020-08-14 ENCOUNTER — Other Ambulatory Visit: Payer: Self-pay

## 2020-08-14 ENCOUNTER — Ambulatory Visit: Payer: Medicare PPO | Attending: Neurology

## 2020-08-14 DIAGNOSIS — R2689 Other abnormalities of gait and mobility: Secondary | ICD-10-CM

## 2020-08-14 DIAGNOSIS — M6281 Muscle weakness (generalized): Secondary | ICD-10-CM | POA: Insufficient documentation

## 2020-08-14 DIAGNOSIS — R2681 Unsteadiness on feet: Secondary | ICD-10-CM

## 2020-08-17 NOTE — Therapy (Addendum)
[place], [place], [address] Phone: [phone]   Fax:  [phone]  Physical Therapy Evaluation  Patient Details  Name: [name] MRN: [id number] Date of Birth: [birth year] Referring Provider (PT): Alda Berthold, DO   Encounter Date: 08/14/2020   PT End of Session - 08/17/20 2107     Visit Number 1    Number of Visits 21    Date for PT Re-Evaluation 10/23/20    Authorization Type [employer] MCR - FOTO 6th and 10th    Progress Note Due on Visit 10    PT Start Time 1350    PT Stop Time 1435    PT Time Calculation (min) 45 min    Activity Tolerance Patient tolerated treatment well;No increased pain    Behavior During Therapy [facility] for tasks assessed/performed             Past Medical History:  Diagnosis Date   Anxiety    Cancer ([place])    Prostate and Basal cell on left nose   Diabetes mellitus without complication (HCC)    GERD (gastroesophageal reflux disease)    History of hiatal hernia    Hypertension    Hypothyroidism     Past Surgical History:  Procedure Laterality Date   BACK SURGERY     x 5- first surgery 2006, 2008, 2009 x 2 and 2011   BASAL CELL CARCINOMA EXCISION Left 09/05/2019   Nose   BIOPSY PROSTATE  10/03/2019   EYE SURGERY  2016   cataract surgery   [place] LAMINECTOMY/DECOMPRESSION MICRODISCECTOMY N/A 12/28/2017   Procedure: LAMINECTOMY AND FORAMINOTOMY LUMBAR TWO- LUMBAR THREE, LUMBAR THREE- LUMBAR FOUR, LUMBAR FOUR- LUMBAR FIVE, RIGHT LUMBAR FOUR- LUMBAR FIVE DISKECTOMY;  Surgeon: Newman Pies, MD;  Location: [place];  Service: Neurosurgery;  Laterality: N/A;   POSTERIOR CERVICAL FUSION/FORAMINOTOMY N/A 11/05/2019   Procedure: LAMINECTOMY AND FORAMINOTOMY, INSTRUMENTATION/FUSION CERVICAL TWO CERVICAL THREE, CERVICAL THREE- CERVICAL FOUR;  Surgeon: Newman Pies, MD;  Location: [place];  Service: Neurosurgery;   Laterality: N/A;    There were no vitals filed for this visit.    Subjective Assessment - 08/17/20 2107     Subjective Pt presents to PT with reports of imbalance and multiple fall. Pt notes that those falls involved tripping over objects. He mainly uses rollator for community ambulation and was using FWW prior to this and occasionally uses for the community. Pt also promotes R ankle pain with intermittent severity.    How long can you sit comfortably? indefinite    How long can you stand comfortably? 10 minutes    How long can you walk comfortably? 10-15 minutes    Patient Stated Goals improve balance/stability and gait    Pain Onset More than a month ago                [facility] PT Assessment - 08/17/20 0001       Assessment   Medical Diagnosis M21.371 (ICD-10-CM) - Right foot drop  E11.40,Z79.4;(ICD-10-CM) - Type 2 diabetes mellitus with diabetic neuropathy, with long-term current use of insulin [facility])    Referring Provider (PT) Alda Berthold, DO    Hand Dominance Right    Next MD Visit 09/09/20      Precautions   Precautions None      Restrictions   Weight Bearing Restrictions No      Home Environment   Living Environment  Private residence    Living Arrangements Spouse/significant other    Type of [place] to enter    Entrance Stairs-Number of Steps 2    [place] One level    [place] - 2 wheels;Tub bench;Grab bars - toilet;Grab bars - tub/shower;Cane - single point;Walker - 4 wheels      Prior Function   Level of Independence Independent;Independent with basic ADLs    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 assessed    Attention Focused      Observation/Other Assessments   Focus on Therapeutic Outcomes (FOTO)  45%      Strength   Right Hip Flexion 4/5    Right Hip ABduction 3+/5    Right Hip ADduction 4+/5    Left Hip  Flexion 4/5    Left Hip ABduction 3+/5    Left Hip ADduction 4+/5    Right Knee Flexion 4+/5    Right Knee Extension 4+/5    Left Knee Flexion 4+/5    Left Knee Extension 4+/5    Right Ankle Dorsiflexion 1/5    Right Ankle Plantar Flexion 2+/5    Left Ankle Dorsiflexion 4/5    Left Ankle Plantar Flexion 4/5      Transfers   Comments 30 Sec STS: 4 reps - very unsteady w/ UE support      Standardized Balance Assessment   Standardized Balance Assessment Timed Up and Go Test      Timed Up and Go Test   Normal TUG (seconds) 20    TUG Comments w/ rollator                        Objective measurements completed on examination: See above findings.       [facility] Adult PT Treatment/Exercise - 08/17/20 0001       Exercises   Exercises Knee/Hip      Knee/Hip Exercises: Supine   Bridges 10 reps                      PT Short Term Goals - 08/14/20 1550       PT SHORT TERM GOAL #1   Title Pt will be knowledgeable and compliant with initial HEP for improved carryover and functional ability    Baseline initial HEP given    Time 3    Period Weeks    Status New    Target Date 09/04/20      PT SHORT TERM GOAL #2   Title PT will perform BERG with appropriate LTG at next session    Time 2    Period Weeks    Status New    Target Date 08/28/20               PT Long Term Goals - 08/14/20 1558       PT LONG TERM GOAL #1   Title Pt will improve FOTO to no less than 53% in order to improve balance and confidence    Baseline 45% function    Time 10    Period Weeks    Status New    Target Date 10/23/20      PT LONG TERM GOAL #2   Title Pt will improve reps in 30 Sec STS to no less than 8 with improved stability  in order to decrease fall risk and improve mobility    Baseline 4 reps - CGA    Time 10    Period Weeks    Status New    Target Date 10/23/20      PT LONG TERM GOAL #3   Title Pt will improve TUG to no greater than 15 seconds with  preferred AD in order to improve safety and decrease fall risk    Baseline 20 seconds    Time 10    Period Weeks    Status New    Target Date 10/23/20                    Plan - 08/17/20 1957     Clinical Impression Statement Pt is a pleasant 75 y/o M who presents to PT with balance and gait deficits after numernous spinal surgeries and R foot drop. His TUG and 30 Sec STS tests indicate he is at a high risk for falls and reduced functional mobility. Likewise, pt's FOTO score indicates fear of falling and shows he is operating below his PLOF. He would benefit from skilled PT services working to improve gait and mobility in order to increase saety.    Personal Factors and Comorbidities Age;Comorbidity 3+;Finances    Comorbidities DM II, cancer, HTN, anxiety    Examination-Activity Limitations Carry;Lift;Squat;Stairs;Locomotion Level;Stand;Bend    Examination-Participation Restrictions Lawyer;Shop;Interpersonal Relationship;Community Activity    Stability/Clinical Decision Making Stable/Uncomplicated    Clinical Decision Making Low    Rehab Potential Good    PT Frequency 2x / week    PT Duration Other (comment)   10 weeks   PT Treatment/Interventions ADLs/Self Care Home Management;Electrical Stimulation;Moist Heat;Stair training;Functional mobility training;Therapeutic activities;Gait training;Therapeutic exercise;Neuromuscular re-education;Balance training;Patient/family education;Manual techniques    PT Next Visit Plan assess response to HEP; gait assessment; progress as able    PT Home Exercise Plan Access Code [place] and Agree with Plan of Care Patient             Patient will benefit from skilled therapeutic intervention in order to improve the following deficits and impairments:  Abnormal gait, Decreased activity tolerance, Decreased balance, Decreased endurance, Decreased mobility, Decreased strength, Difficulty walking, Impaired sensation,  Impaired tone, Pain  Visit Diagnosis: Unsteadiness on feet  Muscle weakness (generalized)  Other abnormalities of gait and mobility     Problem List Patient Active Problem List   Diagnosis Date Noted   Myelopathy concurrent with and due to spinal stenosis of cervical region ([place]) 11/05/2019   Lumbar herniated disc 12/28/2017    Ward Chatters, PT, DPT 08/17/20 9:08 PM  [facility] [address] [place], [place], [address] Phone: [phone]   Fax:  [phone]  Name: [name] MRN: [id number] Date of Birth: [birth year]  Referring diagnosis?  M21.371 (ICD-10-CM) - Right foot drop E11.40,Z79.4 (ICD-10-CM) - Type 2 diabetes mellitus with diabetic neuropathy, with long-term current use of insulin (HCC) Treatment diagnosis? (if different than referring diagnosis)  Unsteadiness on feet Muscle weakness (generalized) Other abnormalities of gait and mobility What was this (referring dx) caused by? []  Surgery []  Fall []  Ongoing issue []  Arthritis [x]  Other: ____________  Laterality: [x]  Rt []  Lt []  Both  Check all possible CPT codes:      [x]  97110 (Therapeutic Exercise)  []  92507 (SLP Treatment)  [x]  97112 (Neuro Re-ed)   []  92526 (Swallowing Treatment)   [x]  97116 (Gait Training)   []  [id number] (  Cognitive Training, 1st 15 minutes) [x]  97140 (Manual Therapy)   []  97130 (Cognitive Training, each add'l 15 minutes)  [x]  97530 (Therapeutic Activities)  []  Other, List CPT Code ____________    [x]  95638 (Self Care)       []  All codes above (97110 - 97535)  []  97012 (Mechanical Traction)  []  97014 (E-stim Unattended)  []  97032 (E-stim manual)  []  97033 (Ionto)  []  97035 (Ultrasound)  []  75643 (Orthotic Fit) []  [id number] (Physical Performance Training) []  [id number] (Aquatic Therapy) []  97034 (Contrast Bath) []  [id number] (Paraffin) []  97597 (Wound Care 1st 20 sq cm) []  32951 (Wound Care each add'l 20 sq cm) []  97016  (Vasopneumatic Device) []  [phone] Comptroller) []  [id number] (Prosthetic Training)

## 2020-08-18 ENCOUNTER — Ambulatory Visit: Payer: Medicare PPO | Admitting: Physical Therapy

## 2020-08-18 ENCOUNTER — Encounter: Payer: Self-pay | Admitting: Physical Therapy

## 2020-08-18 ENCOUNTER — Other Ambulatory Visit: Payer: Self-pay

## 2020-08-18 DIAGNOSIS — R2689 Other abnormalities of gait and mobility: Secondary | ICD-10-CM

## 2020-08-18 DIAGNOSIS — M6281 Muscle weakness (generalized): Secondary | ICD-10-CM | POA: Diagnosis not present

## 2020-08-18 DIAGNOSIS — R2681 Unsteadiness on feet: Secondary | ICD-10-CM | POA: Diagnosis not present

## 2020-08-18 NOTE — Addendum Note (Signed)
Addended by: Ward Chatters on: 08/18/2020 03:06 PM   Modules accepted: Orders

## 2020-08-19 NOTE — Therapy (Signed)
[place], [place], [address] Phone: [phone]   Fax:  [phone]  Physical Therapy Treatment  Patient Details  Name: [name] MRN: [id number] Date of Birth: [birth year] Referring Provider (PT): Alda Berthold, DO   Encounter Date: 08/18/2020   PT End of Session - 08/18/20 1238     Visit Number 2    Number of Visits 21    Date for PT Re-Evaluation 10/23/20    Authorization Type [employer] MCR - FOTO 6th and 10th    Progress Note Due on Visit 10    PT Start Time 6962    PT Stop Time 9528    PT Time Calculation (min) 40 min             Past Medical History:  Diagnosis Date   Anxiety    Cancer ([place])    Prostate and Basal cell on left nose   Diabetes mellitus without complication ([place])    GERD (gastroesophageal reflux disease)    History of hiatal hernia    Hypertension    Hypothyroidism     Past Surgical History:  Procedure Laterality Date   BACK SURGERY     x 5- first surgery 2006, 2008, 2009 x 2 and 2011   BASAL CELL CARCINOMA EXCISION Left 09/05/2019   Nose   BIOPSY PROSTATE  10/03/2019   EYE SURGERY  2016   cataract surgery   [place] LAMINECTOMY/DECOMPRESSION MICRODISCECTOMY N/A 12/28/2017   Procedure: LAMINECTOMY AND FORAMINOTOMY LUMBAR TWO- LUMBAR THREE, LUMBAR THREE- LUMBAR FOUR, LUMBAR FOUR- LUMBAR FIVE, RIGHT LUMBAR FOUR- LUMBAR FIVE DISKECTOMY;  Surgeon: Newman Pies, MD;  Location: [place];  Service: Neurosurgery;  Laterality: N/A;   POSTERIOR CERVICAL FUSION/FORAMINOTOMY N/A 11/05/2019   Procedure: LAMINECTOMY AND FORAMINOTOMY, INSTRUMENTATION/FUSION CERVICAL TWO CERVICAL THREE, CERVICAL THREE- CERVICAL FOUR;  Surgeon: Newman Pies, MD;  Location: [place];  Service: Neurosurgery;  Laterality: N/A;    There were no vitals filed for this visit.   Subjective Assessment - 08/18/20 1236     Subjective Always a little bit  of back pain.    Currently in Pain? Yes    Pain Score 3    aching   Pain Location Ankle    Pain Orientation Right    Pain Descriptors / Indicators Aching    Pain Type Chronic pain    Aggravating Factors  nothing in particular , bending over hurts the back , notice ankel more when am wearing shoes    Pain Relieving Factors wearing hospital socks makes ankle feel better, Aleve makes back better sometimes                [facility] PT Assessment - 08/18/20 0001       Standardized Balance Assessment   Standardized Balance Assessment Berg Balance Test      Berg Balance Test   Sit to Stand Able to stand  independently using hands    Standing Unsupported Unable to stand 30 seconds unassisted    Sitting with Back Unsupported but Feet Supported on Floor or Stool Able to sit safely and securely 2 minutes    Stand to Sit Sits safely with minimal use of hands    Transfers Able to transfer with verbal cueing and /or supervision    Standing Unsupported with Eyes Closed Needs help to keep from falling   needs asssit into rhomberg   Standing Unsupported with Feet  Together Needs help to attain position and unable to hold for 15 seconds    From Standing, Reach Forward with Outstretched Arm Loses balance while trying/requires external support    From Standing Position, Pick up Object from Floor Unable to try/needs assist to keep balance    From Standing Position, Turn to Look Behind Over each Shoulder Needs assist to keep from losing balance and falling    Turn 360 Degrees Needs assistance while turning    Standing Unsupported, Alternately Place Feet on Step/Stool Needs assistance to keep from falling or unable to try    Standing Unsupported, One Foot in [place] balance while stepping or standing    Standing on One Leg Unable to try or needs assist to prevent fall    Total Score 13                           [facility] Adult PT Treatment/Exercise - 08/19/20 0001       Neuro Re-ed     Neuro Re-ed Details  narrow stance at rollator 1 UE light touch 30 sec x 2, able to release 3 se max, normal stance ligth UE touch with head turns , able to release UE 3 sec max.      Knee/Hip Exercises: Seated   Other Seated Knee/Hip Exercises Seated heel raises x 10    Other Seated Knee/Hip Exercises Seated toe raises x 10    Sit to Sand 10 reps;2 sets   CGA, cues for weight shifting to prevent fall backward     Knee/Hip Exercises: Supine   Bridges 10 reps    Bridges Limitations 3 sets    Straight Leg Raises Limitations 10  x 3 Rt/ LT    Other Supine Knee/Hip Exercises clam 10 x 3 red band      Knee/Hip Exercises: Sidelying   Hip ABduction 10 reps    Hip ABduction Limitations Rt , LT                      PT Short Term Goals - 08/18/20 1300       PT SHORT TERM GOAL #1   Title Pt will be knowledgeable and compliant with initial HEP for improved carryover and functional ability.    Baseline initial HEP given, reports compliance    Time 3    Period Weeks    Status On-going    Target Date 09/04/20      PT SHORT TERM GOAL #2   Title PT will perform BERG with appropriate LTG at next session    Baseline 13/56, need to set LTG    Time 2    Period Weeks    Status Partially Met    Target Date 08/28/20               PT Long Term Goals - 08/14/20 1558       PT LONG TERM GOAL #1   Title Pt will improve FOTO to no less than 53% in order to improve balance and confidence    Baseline 45% function    Time 10    Period Weeks    Status New    Target Date 10/23/20      PT LONG TERM GOAL #2   Title Pt will improve reps in 30 Sec STS to no less than 8 with improved stability in order to decrease fall risk and improve mobility    Baseline 4  reps - CGA    Time 10    Period Weeks    Status New    Target Date 10/23/20      PT LONG TERM GOAL #3   Title Pt will improve TUG to no greater than 15 seconds with preferred AD in order to improve safety and decrease fall  risk    Baseline 20 seconds    Time 10    Period Weeks    Status New    Target Date 10/23/20                   Plan - 08/18/20 1256     Clinical Impression Statement Mr Parisien arrives with rollator. He ambulates into clinic and has a near fall when turning to sit on mat table. He was able to sit on the table without falling. BERG 13/56. When he releases UE support , his knees bend and he needs UE assist to stabilize. Worked on balance at rollator in normal and narrow stance progressing to light finger touch and releasing for 3 seconds at most. Reviewed HEP strengthening and he reported compliance and good technique.    Personal Factors and Comorbidities Age;Comorbidity 3+;Finances    Comorbidities DM II, cancer, HTN, anxiety    Examination-Participation Restrictions [employer];Shop;Interpersonal Relationship;Community Activity    Stability/Clinical Decision Making Stable/Uncomplicated    PT Next Visit Plan assess response to HEP; gait assessment; progress as able, set BERG Goal (13/56 )    PT Home Exercise Plan Access Code [place] and Agree with Plan of Care Patient             Patient will benefit from skilled therapeutic intervention in order to improve the following deficits and impairments:     Visit Diagnosis: Muscle weakness (generalized)  Unsteadiness on feet  Other abnormalities of gait and mobility     Problem List Patient Active Problem List   Diagnosis Date Noted   Myelopathy concurrent with and due to spinal stenosis of cervical region [facility]) 11/05/2019   Lumbar herniated disc 12/28/2017    Dorene Ar, PTA 08/19/2020, 7:39 AM  [facility] [address] [place], [place], [address] Phone: [phone]   Fax:  [phone]  Name: [name] MRN: [id number] Date of Birth: Sep 14, 1945

## 2020-08-20 ENCOUNTER — Other Ambulatory Visit: Payer: Self-pay

## 2020-08-20 ENCOUNTER — Ambulatory Visit: Payer: Medicare PPO

## 2020-08-20 DIAGNOSIS — R2681 Unsteadiness on feet: Secondary | ICD-10-CM | POA: Diagnosis not present

## 2020-08-20 DIAGNOSIS — M6281 Muscle weakness (generalized): Secondary | ICD-10-CM

## 2020-08-20 DIAGNOSIS — R2689 Other abnormalities of gait and mobility: Secondary | ICD-10-CM | POA: Diagnosis not present

## 2020-08-20 NOTE — Therapy (Signed)
[place], [place], [address] Phone: [phone]   Fax:  [phone]  Physical Therapy Treatment  Patient Details  Name: [name] MRN: [id number] Date of Birth: October 25, 1945 Referring Provider (PT): Alda Berthold, DO   Encounter Date: 08/20/2020   PT End of Session - 08/20/20 1213     Visit Number 3    Number of Visits 21    Date for PT Re-Evaluation 10/23/20    Authorization Type [employer] MCR - FOTO 6th and 10th    Progress Note Due on Visit 10    PT Start Time 1215    PT Stop Time 1255    PT Time Calculation (min) 40 min    Activity Tolerance Patient tolerated treatment well;No increased pain    Behavior During Therapy [facility] for tasks assessed/performed             Past Medical History:  Diagnosis Date   Anxiety    Cancer ([place])    Prostate and Basal cell on left nose   Diabetes mellitus without complication (HCC)    GERD (gastroesophageal reflux disease)    History of hiatal hernia    Hypertension    Hypothyroidism     Past Surgical History:  Procedure Laterality Date   BACK SURGERY     x 5- first surgery 2006, 2008, 2009 x 2 and 2011   BASAL CELL CARCINOMA EXCISION Left 09/05/2019   Nose   BIOPSY PROSTATE  10/03/2019   EYE SURGERY  2016   cataract surgery   [place] LAMINECTOMY/DECOMPRESSION MICRODISCECTOMY N/A 12/28/2017   Procedure: LAMINECTOMY AND FORAMINOTOMY LUMBAR TWO- LUMBAR THREE, LUMBAR THREE- LUMBAR FOUR, LUMBAR FOUR- LUMBAR FIVE, RIGHT LUMBAR FOUR- LUMBAR FIVE DISKECTOMY;  Surgeon: Newman Pies, MD;  Location: [place];  Service: Neurosurgery;  Laterality: N/A;   POSTERIOR CERVICAL FUSION/FORAMINOTOMY N/A 11/05/2019   Procedure: LAMINECTOMY AND FORAMINOTOMY, INSTRUMENTATION/FUSION CERVICAL TWO CERVICAL THREE, CERVICAL THREE- CERVICAL FOUR;  Surgeon: Newman Pies, MD;  Location: [place];  Service: Neurosurgery;   Laterality: N/A;    There were no vitals filed for this visit.   Subjective Assessment - 08/20/20 1214     Subjective Pt presents to PT with reports of some continued lower back pain. He continues to be compliant with HEP with no adverse effect. He is ready to begin PT at this time.    Currently in Pain? Yes    Pain Score 3     Pain Location Back    Pain Orientation Lower                               [facility] Adult PT Treatment/Exercise - 08/20/20 0001       Ambulation/Gait   Gait Comments ambulated 158ft with AFO donne donned on R LE; slightly improved gait pattern secondary to decreased foot drop on R      Neuro Re-ed    Neuro Re-ed Details  FT EO in // - unable to hold, slightly apart 3x20 sec; half kneeling 2x30 sec no UE support on mat      Knee/Hip Exercises: Standing   Hip Abduction 10 reps;Right      Knee/Hip Exercises: Seated   Long Arc Quad 2 sets;10 reps;Both    Long Arc Quad Weight 2 lbs.    Sit to Sand 10 reps;without UE support   on  4in platform with PT guarding R knee     Knee/Hip Exercises: Supine   Bridges 3 sets;10 reps    Straight Leg Raises 3 sets;10 reps;Both    Other Supine Knee/Hip Exercises clamshell 3x15 green tband      Ankle Exercises: Seated   Heel Raises 15 reps    Heel Raises Limitations x2                      PT Short Term Goals - 08/18/20 1300       PT SHORT TERM GOAL #1   Title Pt will be knowledgeable and compliant with initial HEP for improved carryover and functional ability.    Baseline initial HEP given, reports compliance    Time 3    Period Weeks    Status On-going    Target Date 09/04/20      PT SHORT TERM GOAL #2   Title PT will perform BERG with appropriate LTG at next session    Baseline 13/56, need to set LTG    Time 2    Period Weeks    Status Partially Met    Target Date 08/28/20               PT Long Term Goals - 08/14/20 1558       PT LONG TERM GOAL #1   Title Pt  will improve FOTO to no less than 53% in order to improve balance and confidence    Baseline 45% function    Time 10    Period Weeks    Status New    Target Date 10/23/20      PT LONG TERM GOAL #2   Title Pt will improve reps in 30 Sec STS to no less than 8 with improved stability in order to decrease fall risk and improve mobility    Baseline 4 reps - CGA    Time 10    Period Weeks    Status New    Target Date 10/23/20      PT LONG TERM GOAL #3   Title Pt will improve TUG to no greater than 15 seconds with preferred AD in order to improve safety and decrease fall risk    Baseline 20 seconds    Time 10    Period Weeks    Status New    Target Date 10/23/20                   Plan - 08/20/20 1531     Clinical Impression Statement Pt was able to complete prescribed exercises with no adverse effect. He showed slightly improved ambulation with clinic AFO donned on R LE. He had difficulty with half kneeling position with increased sway noted. He continues to benefit from skilled PT services and should continue to be seen and progressed as tolerated. PT will message MD about possible ordering of AFO for pt as well.    PT Treatment/Interventions ADLs/Self Care Home Management;Electrical Stimulation;Moist Heat;Stair training;Functional mobility training;Therapeutic activities;Gait training;Therapeutic exercise;Neuromuscular re-education;Balance training;Patient/family education;Manual techniques    PT Next Visit Plan continue to progress half kneeling stances; strengthening as able    PT Home Exercise Plan Access Code [facility]             Patient will benefit from skilled therapeutic intervention in order to improve the following deficits and impairments:  Abnormal gait, Decreased activity tolerance, Decreased balance, Decreased endurance, Decreased mobility, Decreased strength, Difficulty walking, Impaired sensation, Impaired tone, Pain  Visit Diagnosis: Muscle weakness  (generalized)  Unsteadiness on feet  Other abnormalities of gait and mobility     Problem List Patient Active Problem List   Diagnosis Date Noted   Myelopathy concurrent with and due to spinal stenosis of cervical region [facility]) 11/05/2019   Lumbar herniated disc 12/28/2017    Ward Chatters, PT, DPT 08/20/20 3:37 PM  [place] [facility] [address] [place], [place], [address] Phone: [phone]   Fax:  [phone]  Name: RYMAN RATHGEBER MRN: [id number] Date of Birth: [birth year]

## 2020-09-01 DIAGNOSIS — E538 Deficiency of other specified B group vitamins: Secondary | ICD-10-CM | POA: Diagnosis not present

## 2020-09-02 DIAGNOSIS — B351 Tinea unguium: Secondary | ICD-10-CM | POA: Diagnosis not present

## 2020-09-03 ENCOUNTER — Ambulatory Visit: Payer: Medicare PPO | Attending: Neurology

## 2020-09-03 ENCOUNTER — Other Ambulatory Visit: Payer: Self-pay

## 2020-09-03 DIAGNOSIS — R2681 Unsteadiness on feet: Secondary | ICD-10-CM | POA: Diagnosis not present

## 2020-09-03 DIAGNOSIS — R2689 Other abnormalities of gait and mobility: Secondary | ICD-10-CM

## 2020-09-03 DIAGNOSIS — M6281 Muscle weakness (generalized): Secondary | ICD-10-CM

## 2020-09-03 NOTE — Therapy (Signed)
[place], [place], [address] Phone: [phone]   Fax:  [phone]  Physical Therapy Treatment  Patient Details  Name: [name] MRN: [id number] Date of Birth: June 04, 1945 Referring Provider (PT): Alda Berthold, DO   Encounter Date: 09/03/2020   PT End of Session - 09/03/20 1443     Visit Number 4    Number of Visits 21    Date for PT Re-Evaluation 10/23/20    Authorization Type [employer] MCR - FOTO 6th and 10th    Progress Note Due on Visit 10    PT Start Time 9798    PT Stop Time 1527    PT Time Calculation (min) 42 min    Activity Tolerance Patient tolerated treatment well;No increased pain    Behavior During Therapy [facility] for tasks assessed/performed             Past Medical History:  Diagnosis Date   Anxiety    Cancer ([place])    Prostate and Basal cell on left nose   Diabetes mellitus without complication (HCC)    GERD (gastroesophageal reflux disease)    History of hiatal hernia    Hypertension    Hypothyroidism     Past Surgical History:  Procedure Laterality Date   BACK SURGERY     x 5- first surgery 2006, 2008, 2009 x 2 and 2011   BASAL CELL CARCINOMA EXCISION Left 09/05/2019   Nose   BIOPSY PROSTATE  10/03/2019   EYE SURGERY  2016   cataract surgery   [place] LAMINECTOMY/DECOMPRESSION MICRODISCECTOMY N/A 12/28/2017   Procedure: LAMINECTOMY AND FORAMINOTOMY LUMBAR TWO- LUMBAR THREE, LUMBAR THREE- LUMBAR FOUR, LUMBAR FOUR- LUMBAR FIVE, RIGHT LUMBAR FOUR- LUMBAR FIVE DISKECTOMY;  Surgeon: Newman Pies, MD;  Location: [place];  Service: Neurosurgery;  Laterality: N/A;   POSTERIOR CERVICAL FUSION/FORAMINOTOMY N/A 11/05/2019   Procedure: LAMINECTOMY AND FORAMINOTOMY, INSTRUMENTATION/FUSION CERVICAL TWO CERVICAL THREE, CERVICAL THREE- CERVICAL FOUR;  Surgeon: Newman Pies, MD;  Location: [place];  Service: Neurosurgery;   Laterality: N/A;    There were no vitals filed for this visit.   Subjective Assessment - 09/03/20 1443     Subjective Pt presents to PT with continued reports of lower back and R ankle pain. He has [place] from the beach and was fairly compliant with HEP. Pt is ready to begin PT treatment at this time.    Currently in Pain? Yes    Pain Score 4     Pain Location Back    Pain Orientation Lower                               [facility] Adult PT Treatment/Exercise - 09/03/20 0001       Ambulation/Gait   Gait Comments ambulated 178f with AFO donne donned on R LE; slightly improved gait pattern secondary to decreased foot drop on R      Knee/Hip Exercises: Aerobic   Nustep lvl 5 x 4 min UE/LE x 4 min while taking subjective      Knee/Hip Exercises: Seated   Long Arc Quad 2 sets;15 reps    Long Arc Quad Weight 2 lbs.      Knee/Hip Exercises: Supine   Bridges 3 sets;10 reps    Other Supine Knee/Hip Exercises bent knee fallout 2x10 green tband      Ankle Exercises:  Seated   Heel Raises Limitations 2x25      Ankle Exercises: Supine   T-Band ankle DF red tband 2x10 ea                    PT Education - 09/03/20 1531     Education Details HEP update    Person(s) Educated Patient    Methods Explanation;Demonstration;Handout    Comprehension Verbalized understanding;Returned demonstration              PT Short Term Goals - 08/18/20 1300       PT SHORT TERM GOAL #1   Title Pt will be knowledgeable and compliant with initial HEP for improved carryover and functional ability.    Baseline initial HEP given, reports compliance    Time 3    Period Weeks    Status On-going    Target Date 09/04/20      PT SHORT TERM GOAL #2   Title PT will perform BERG with appropriate LTG at next session    Baseline 13/56, need to set LTG    Time 2    Period Weeks    Status Partially Met    Target Date 08/28/20               PT Long Term  Goals - 08/14/20 1558       PT LONG TERM GOAL #1   Title Pt will improve FOTO to no less than 53% in order to improve balance and confidence    Baseline 45% function    Time 10    Period Weeks    Status New    Target Date 10/23/20      PT LONG TERM GOAL #2   Title Pt will improve reps in 30 Sec STS to no less than 8 with improved stability in order to decrease fall risk and improve mobility    Baseline 4 reps - CGA    Time 10    Period Weeks    Status New    Target Date 10/23/20      PT LONG TERM GOAL #3   Title Pt will improve TUG to no greater than 15 seconds with preferred AD in order to improve safety and decrease fall risk    Baseline 20 seconds    Time 10    Period Weeks    Status New    Target Date 10/23/20                   Plan - 09/03/20 1535     Clinical Impression Statement Pt was able to complete all prescribed exercises with no adverse effect or increase in pain. He continues to be very unsteady with STS and gait, with frequent R knee buckling in weight bearing position. He continues to benefit from skilled PT, with progression towards half kneeling and tall kneeling for increasing balance. Will continue per POC as prescribed.    PT Treatment/Interventions ADLs/Self Care Home Management;Electrical Stimulation;Moist Heat;Stair training;Functional mobility training;Therapeutic activities;Gait training;Therapeutic exercise;Neuromuscular re-education;Balance training;Patient/family education;Manual techniques    PT Next Visit Plan continue to progress half kneeling stances; strengthening as able    PT Home Exercise Plan Access Code [facility]             Patient will benefit from skilled therapeutic intervention in order to improve the following deficits and impairments:  Abnormal gait, Decreased activity tolerance, Decreased balance, Decreased endurance, Decreased mobility, Decreased strength, Difficulty walking, Impaired sensation, Impaired tone,  Pain  Visit Diagnosis: Muscle weakness (generalized)  Unsteadiness on feet  Other abnormalities of gait and mobility     Problem List Patient Active Problem List   Diagnosis Date Noted   Myelopathy concurrent with and due to spinal stenosis of cervical region [facility]) 11/05/2019   Lumbar herniated disc 12/28/2017    Ward Chatters, PT, DPT 09/03/20 3:44 PM  [place] [facility] [address] [place], [place], [address] Phone: [phone]   Fax:  [phone]  Name: [name] MRN: [id number] Date of Birth: Aug 16, 1945

## 2020-09-05 ENCOUNTER — Encounter: Payer: Self-pay | Admitting: Physical Therapy

## 2020-09-05 ENCOUNTER — Ambulatory Visit: Payer: Medicare PPO | Admitting: Physical Therapy

## 2020-09-05 ENCOUNTER — Other Ambulatory Visit: Payer: Self-pay

## 2020-09-05 DIAGNOSIS — M6281 Muscle weakness (generalized): Secondary | ICD-10-CM

## 2020-09-05 DIAGNOSIS — R2681 Unsteadiness on feet: Secondary | ICD-10-CM

## 2020-09-05 DIAGNOSIS — R2689 Other abnormalities of gait and mobility: Secondary | ICD-10-CM

## 2020-09-05 NOTE — Therapy (Signed)
[place], [place], [address] Phone: [phone]   Fax:  [phone]  Physical Therapy Treatment  Patient Details  Name: [name] MRN: [id number] Date of Birth: Jul 16, 1945 Referring Provider (PT): Alda Berthold, DO   Encounter Date: 09/05/2020   PT End of Session - 09/05/20 1102     Visit Number 5    Number of Visits 21    Date for PT Re-Evaluation 10/23/20    Authorization Type [employer] MCR - FOTO 6th and 10th    PT Start Time 1015    PT Stop Time 1100    PT Time Calculation (min) 45 min             Past Medical History:  Diagnosis Date   Anxiety    Cancer ([place])    Prostate and Basal cell on left nose   Diabetes mellitus without complication ([place])    GERD (gastroesophageal reflux disease)    History of hiatal hernia    Hypertension    Hypothyroidism     Past Surgical History:  Procedure Laterality Date   BACK SURGERY     x 5- first surgery 2006, 2008, 2009 x 2 and 2011   BASAL CELL CARCINOMA EXCISION Left 09/05/2019   Nose   BIOPSY PROSTATE  10/03/2019   EYE SURGERY  2016   cataract surgery   [place] LAMINECTOMY/DECOMPRESSION MICRODISCECTOMY N/A 12/28/2017   Procedure: LAMINECTOMY AND FORAMINOTOMY LUMBAR TWO- LUMBAR THREE, LUMBAR THREE- LUMBAR FOUR, LUMBAR FOUR- LUMBAR FIVE, RIGHT LUMBAR FOUR- LUMBAR FIVE DISKECTOMY;  Surgeon: Newman Pies, MD;  Location: [place];  Service: Neurosurgery;  Laterality: N/A;   POSTERIOR CERVICAL FUSION/FORAMINOTOMY N/A 11/05/2019   Procedure: LAMINECTOMY AND FORAMINOTOMY, INSTRUMENTATION/FUSION CERVICAL TWO CERVICAL THREE, CERVICAL THREE- CERVICAL FOUR;  Surgeon: Newman Pies, MD;  Location: [place];  Service: Neurosurgery;  Laterality: N/A;    There were no vitals filed for this visit.   Subjective Assessment - 09/05/20 1016     Subjective Pt reports pain in right shin today that he attributes  to a rod that is in his leg. Otherwise his back and ankles are feeling okay.    Currently in Pain? Yes    Pain Score 3     Pain Location Leg    Pain Orientation Right;Lower;Anterior    Pain Descriptors / Indicators Aching                               [facility] Adult PT Treatment/Exercise - 09/05/20 0001       Ambulation/Gait   Gait Comments 370 ft with Rollator with in cues for step length      Neuro Re-ed    Neuro Re-ed Details  narrow stance on foam pad with eyes closed, light UE touch with 1 UE multiple trials , marching on foam with 1 UE      Knee/Hip Exercises: Aerobic   Nustep Level 4 LE only x 5 min      Knee/Hip Exercises: Seated   Other Seated Knee/Hip Exercises kneeling 30 sec x 2 EO, then x 2 Eyes closed, then x 1 with EO and overhead raise x 10, then half kneeling (right foot forward) with HHA x 60 sec- pt began to have low back pain and right thigh pain    Sit to Sand 15 reps   1 UE on  rollator     Knee/Hip Exercises: Supine   Bridges 1 set;10 reps    Bridges with Clamshell 20 reps    Straight Leg Raises 3 sets;10 reps;Both    Straight Leg Raises Limitations rt/lt    Other Supine Knee/Hip Exercises bent knee fallout 2x10 green tband      Knee/Hip Exercises: Sidelying   Clams green band x 20 Rt/LT      Ankle Exercises: Seated   Heel Raises Limitations 2x25      Ankle Exercises: Supine   T-Band right red DF left seated x 25, right AROM only supine x 25                      PT Short Term Goals - 08/18/20 1300       PT SHORT TERM GOAL #1   Title Pt will be knowledgeable and compliant with initial HEP for improved carryover and functional ability.    Baseline initial HEP given, reports compliance    Time 3    Period Weeks    Status On-going    Target Date 09/04/20      PT SHORT TERM GOAL #2   Title PT will perform BERG with appropriate LTG at next session    Baseline 13/56, need to set LTG    Time 2    Period Weeks     Status Partially Met    Target Date 08/28/20               PT Long Term Goals - 08/14/20 1558       PT LONG TERM GOAL #1   Title Pt will improve FOTO to no less than 53% in order to improve balance and confidence    Baseline 45% function    Time 10    Period Weeks    Status New    Target Date 10/23/20      PT LONG TERM GOAL #2   Title Pt will improve reps in 30 Sec STS to no less than 8 with improved stability in order to decrease fall risk and improve mobility    Baseline 4 reps - CGA    Time 10    Period Weeks    Status New    Target Date 10/23/20      PT LONG TERM GOAL #3   Title Pt will improve TUG to no greater than 15 seconds with preferred AD in order to improve safety and decrease fall risk    Baseline 20 seconds    Time 10    Period Weeks    Status New    Target Date 10/23/20                   Plan - 09/05/20 1107     Clinical Impression Statement Pt arrives with mild pain in anterior shin and otherwise no back or ankle pain. Able to progress core/ balance to half kneeling with HHA required to maintain balance in neutral. Continued with standing balance using unsteady surface to challenge. He is unable to release both UE for more than 3-5 sec at at time. Continued with mat based hip and ankle strengthening. He had some increased pain in low back and right thigh during half kneeling, He had no pain at end of session.    PT Next Visit Plan continue to progress half kneeling stances; strengthening as able    PT Home Exercise Plan Access Code [facility]  Patient will benefit from skilled therapeutic intervention in order to improve the following deficits and impairments:  Abnormal gait, Decreased activity tolerance, Decreased balance, Decreased endurance, Decreased mobility, Decreased strength, Difficulty walking, Impaired sensation, Impaired tone, Pain  Visit Diagnosis: Muscle weakness (generalized)  Unsteadiness on feet  Other  abnormalities of gait and mobility     Problem List Patient Active Problem List   Diagnosis Date Noted   Myelopathy concurrent with and due to spinal stenosis of cervical region [facility]) 11/05/2019   Lumbar herniated disc 12/28/2017    Dorene Ar, PTA 09/05/2020, 11:10 AM  [facility] [address] [place], [place], [address] Phone: [phone]   Fax:  [phone]  Name: [name] MRN: [id number] Date of Birth: [birth year]

## 2020-09-08 ENCOUNTER — Ambulatory Visit: Payer: Medicare PPO

## 2020-09-08 ENCOUNTER — Other Ambulatory Visit: Payer: Self-pay

## 2020-09-08 DIAGNOSIS — M6281 Muscle weakness (generalized): Secondary | ICD-10-CM | POA: Diagnosis not present

## 2020-09-08 DIAGNOSIS — R2681 Unsteadiness on feet: Secondary | ICD-10-CM | POA: Diagnosis not present

## 2020-09-08 DIAGNOSIS — R2689 Other abnormalities of gait and mobility: Secondary | ICD-10-CM | POA: Diagnosis not present

## 2020-09-08 NOTE — Therapy (Signed)
[name], [place], [address] Phone: [phone]   Fax:  [phone]  Physical Therapy Treatment  Patient Details  Name: [name] MRN: [id number] Date of Birth: 01/01/1946 Referring Provider (PT): Alda Berthold, DO   Encounter Date: 09/08/2020   PT End of Session - 09/08/20 1526     Visit Number 6    Number of Visits 21    Date for PT Re-Evaluation 10/23/20    Authorization Type [employer] MCR - FOTO 6th and 10th    PT Start Time 6962    PT Stop Time 1611    PT Time Calculation (min) 41 min    Activity Tolerance Patient tolerated treatment well;No increased pain    Behavior During Therapy [facility] for tasks assessed/performed             Past Medical History:  Diagnosis Date   Anxiety    Cancer ([place])    Prostate and Basal cell on left nose   Diabetes mellitus without complication (HCC)    GERD (gastroesophageal reflux disease)    History of hiatal hernia    Hypertension    Hypothyroidism     Past Surgical History:  Procedure Laterality Date   BACK SURGERY     x 5- first surgery 2006, 2008, 2009 x 2 and 2011   BASAL CELL CARCINOMA EXCISION Left 09/05/2019   Nose   BIOPSY PROSTATE  10/03/2019   EYE SURGERY  2016   cataract surgery   [place] LAMINECTOMY/DECOMPRESSION MICRODISCECTOMY N/A 12/28/2017   Procedure: LAMINECTOMY AND FORAMINOTOMY LUMBAR TWO- LUMBAR THREE, LUMBAR THREE- LUMBAR FOUR, LUMBAR FOUR- LUMBAR FIVE, RIGHT LUMBAR FOUR- LUMBAR FIVE DISKECTOMY;  Surgeon: Newman Pies, MD;  Location: [place];  Service: Neurosurgery;  Laterality: N/A;   POSTERIOR CERVICAL FUSION/FORAMINOTOMY N/A 11/05/2019   Procedure: LAMINECTOMY AND FORAMINOTOMY, INSTRUMENTATION/FUSION CERVICAL TWO CERVICAL THREE, CERVICAL THREE- CERVICAL FOUR;  Surgeon: Newman Pies, MD;  Location: [place];  Service: Neurosurgery;  Laterality: N/A;    There were no vitals  filed for this visit.   Subjective Assessment - 09/08/20 1527     Subjective Pt notes some lower back pain the previous night, but this has decreased after taking alleve this morning. He has been compliant with HEP with no adverse effect. Pt is ready to begin PT at this time.    Currently in Pain? No/denies    Pain Score 0-No pain                               [facility] Adult PT Treatment/Exercise - 09/08/20 0001       Neuro Re-ed    Neuro Re-ed Details  narrow stance on foam pad with eyes closed 2x30 sec marching on foam 1 UE 2x20      Knee/Hip Exercises: Aerobic   Nustep Level 4 LE only x 5 min      Knee/Hip Exercises: Seated   Long Arc Quad 2 sets;15 reps    Long Arc Quad Weight 3 lbs.    Other Seated Knee/Hip Exercises kneeling mini squats 2x10; half kneeling alt arm raise 1UE support; tall kneeling on foam alt arm raises    Marching 20 reps;Weights;Both    Marching Weights 3 lbs.    Sit to Sand 2 sets;10 reps;with UE support   2 UE on rollator     Knee/Hip  Exercises: Supine   Bridges with Clamshell 3 sets;10 reps   green tband   Other Supine Knee/Hip Exercises bent knee fallout 2x15green tband      Ankle Exercises: Supine   T-Band right red DF left seated x 25 2x15; 2x15 YTB R PF                      PT Short Term Goals - 08/18/20 1300       PT SHORT TERM GOAL #1   Title Pt will be knowledgeable and compliant with initial HEP for improved carryover and functional ability.    Baseline initial HEP given, reports compliance    Time 3    Period Weeks    Status On-going    Target Date 09/04/20      PT SHORT TERM GOAL #2   Title PT will perform BERG with appropriate LTG at next session    Baseline 13/56, need to set LTG    Time 2    Period Weeks    Status Partially Met    Target Date 08/28/20               PT Long Term Goals - 08/14/20 1558       PT LONG TERM GOAL #1   Title Pt will improve FOTO to no less than 53% in order  to improve balance and confidence    Baseline 45% function    Time 10    Period Weeks    Status New    Target Date 10/23/20      PT LONG TERM GOAL #2   Title Pt will improve reps in 30 Sec STS to no less than 8 with improved stability in order to decrease fall risk and improve mobility    Baseline 4 reps - CGA    Time 10    Period Weeks    Status New    Target Date 10/23/20      PT LONG TERM GOAL #3   Title Pt will improve TUG to no greater than 15 seconds with preferred AD in order to improve safety and decrease fall risk    Baseline 20 seconds    Time 10    Period Weeks    Status New    Target Date 10/23/20                   Plan - 09/08/20 1615     Clinical Impression Statement Pt was once again able to complete prescribed exercises with no adverse effect or increase in pain. He continues to progress balance challenges well in tall kneeling and semi-quadruped positions. Today's session focused on continued strengthening and balance exercises as tolerated. Pt continues to benefit from skilled PT and will continue to be seen per POC as prescribed.    PT Treatment/Interventions ADLs/Self Care Home Management;Electrical Stimulation;Moist Heat;Stair training;Functional mobility training;Therapeutic activities;Gait training;Therapeutic exercise;Neuromuscular re-education;Balance training;Patient/family education;Manual techniques    PT Next Visit Plan continue to progress half kneeling stances; strengthening as able    PT Home Exercise Plan Access Code [facility]             Patient will benefit from skilled therapeutic intervention in order to improve the following deficits and impairments:  Abnormal gait, Decreased activity tolerance, Decreased balance, Decreased endurance, Decreased mobility, Decreased strength, Difficulty walking, Impaired sensation, Impaired tone, Pain  Visit Diagnosis: Muscle weakness (generalized)  Unsteadiness on feet  Other abnormalities of  gait and mobility  Problem List Patient Active Problem List   Diagnosis Date Noted   Myelopathy concurrent with and due to spinal stenosis of cervical region [facility]) 11/05/2019   Lumbar herniated disc 12/28/2017    Ward Chatters, PT, DPT 09/08/20 4:17 PM  [facility] [address] [place], [place], [address] Phone: [phone]   Fax:  [phone]  Name: ARLEY SALAMONE MRN: [id number] Date of Birth: 04/10/1945

## 2020-09-09 ENCOUNTER — Ambulatory Visit: Payer: Medicare PPO | Admitting: Neurology

## 2020-09-09 DIAGNOSIS — M5417 Radiculopathy, lumbosacral region: Secondary | ICD-10-CM

## 2020-09-09 DIAGNOSIS — M21371 Foot drop, right foot: Secondary | ICD-10-CM | POA: Diagnosis not present

## 2020-09-09 DIAGNOSIS — E1140 Type 2 diabetes mellitus with diabetic neuropathy, unspecified: Secondary | ICD-10-CM

## 2020-09-09 DIAGNOSIS — Z794 Long term (current) use of insulin: Secondary | ICD-10-CM

## 2020-09-09 NOTE — Procedures (Signed)
[facility]  [place], [place], [place] [address] Tel: [phone] Fax:  [phone] Test Date:  09/09/2020  Patient: [name] DOB: 1945/04/10 Physician: Narda Amber, DO  Sex: Male Height: 5\' 8"  Ref Phys: Narda Amber, DO  ID#: [id number]   Technician:    Patient Complaints: This is a 75 year old man with history of diabetes and lumbar surgery x 5 referred for evaluation of bilateral leg weakness, numbness, and imbalance.  NCV & EMG Findings: Extensive electrodiagnostic testing of the right lower extremity and additional studies of the left shows:  Bilateral sural and superficial peroneal sensory responses are absent. Left peroneal motor response at the tibialis anterior shows reduced amplitude (1.6 mV). Bilateral tibial and peroneal motor responses are absent. Bilateral tibial H reflex studies are absent. Despite maximal activation, no motor unit recruitment was seen in the right lower extremity below the knee or the left gastrocnemius muscle.  Chronic motor axonal loss changes are seen affecting the right rectus femoris, left biceps femoris short head, left anterior tibialis, and bilateral gluteus medius muscles.  There is no evidence of fasciculations or fibrillation potentials involving any of the tested muscles.  Impression: The electrophysiologic findings are consistent with a chronic sensorimotor axonal polyneuropathy affecting the lower extremities, very severe. There is evidence of superimposed chronic right L3-S1 radiculopathy and left L5-S1 radiculopathy.   ___________________________ Narda Amber, DO    Nerve Conduction Studies Anti Sensory Summary Table   Stim Site NR Peak (ms) Norm Peak (ms) P-T Amp (V) Norm P-T Amp  Left Sup Peroneal Anti Sensory (Ant Lat Mall)  32C  12 cm NR  <4.6  >3  Right Sup Peroneal Anti Sensory (Ant Lat Mall)  32C  12 cm NR  <4.6  >3  Left Sural Anti Sensory (Lat Mall)  32C  Calf NR  <4.6  >3   Right Sural Anti Sensory (Lat Mall)  32C  Calf NR  <4.6  >3   Motor Summary Table   Stim Site NR Onset (ms) Norm Onset (ms) O-P Amp (mV) Norm O-P Amp Site1 Site2 Delta-0 (ms) Dist (cm) Vel (m/s) Norm Vel (m/s)  Left Peroneal Motor (Ext Dig Brev)  32C  Ankle NR  <6.0  >2.5 B Fib Ankle  0.0  >40  B Fib NR     Poplt B Fib  0.0  >40  Poplt NR            Right Peroneal Motor (Ext Dig Brev)  32C  Ankle NR  <6.0  >2.5 B Fib Ankle  0.0  >40  B Fib NR     Poplt B Fib  0.0  >40  Poplt NR            Left Peroneal TA Motor (Tib Ant)  32C  Fib Head    4.4 <4.5 1.6 >3 Poplit Fib Head 1.9 8.0 42 >40  Poplit    6.3  1.5         Right Peroneal TA Motor (Tib Ant)  32C  Fib Head NR  <4.5  >3 Poplit Fib Head  0.0  >40  Poplit NR            Left Tibial Motor ([place]d Hall Brev)  32C  Ankle NR  <6.0  >4 Knee Ankle  0.0  >40  Knee NR            Right Tibial Motor (Abd Hall Brev)  32C  Ankle NR  <6.0  >4  Knee Ankle  0.0  >40  Knee NR             H Reflex Studies   NR H-Lat (ms) Lat Norm (ms) L-R H-Lat (ms)  Left Tibial ([place]roc)  32C  NR  <35   Right Tibial (Gastroc)  32C  NR  <35    EMG   Side Muscle Ins Act Fibs Psw Fasc Number Recrt Dur Dur. Amp Amp. Poly Poly. Comment  Right AntTibialis Nml Nml Nml Nml None None - - - - - - ATR  Right Gastroc Nml Nml Nml Nml None None - - - - - - ATR  Right RectFemoris Nml Nml Nml Nml 2- Rapid Some 1+ Some 1+ Some 1+ N/A  Right GluteusMed Nml Nml Nml Nml 2- Rapid Some 1+ Some 1+ Some 1+ N/A  Left BicepsFemS Nml Nml Nml Nml 2- Rapid Some 1+ Some 1+ Some 1+ N/A  Right BicepsFemS Nml Nml Nml Nml Nml Nml Nml Nml Nml Nml Nml Nml N/A  Left AntTibialis Nml Nml Nml Nml 3- Rapid Many 1+ Many 1+ Many 1+ N/A  Left Gastroc Nml Nml Nml Nml None None - - - - - - N/A  Left RectFemoris Nml Nml Nml Nml Nml Nml Nml Nml Nml Nml Nml Nml N/A  Left GluteusMed Nml Nml Nml Nml 2- Rapid Some 1+ Some 2+ Some 1+ N/A      Waveforms:

## 2020-09-10 ENCOUNTER — Other Ambulatory Visit: Payer: Self-pay

## 2020-09-10 ENCOUNTER — Ambulatory Visit: Payer: Medicare PPO

## 2020-09-10 DIAGNOSIS — R2689 Other abnormalities of gait and mobility: Secondary | ICD-10-CM | POA: Diagnosis not present

## 2020-09-10 DIAGNOSIS — M6281 Muscle weakness (generalized): Secondary | ICD-10-CM | POA: Diagnosis not present

## 2020-09-10 DIAGNOSIS — R2681 Unsteadiness on feet: Secondary | ICD-10-CM

## 2020-09-10 NOTE — Therapy (Signed)
[name], [place], [address] Phone: [phone]   Fax:  [phone]  Physical Therapy Treatment  Patient Details  Name: [name] MRN: [id number] Date of Birth: Mar 25, 1945 Referring Provider (PT): Alda Berthold, DO   Encounter Date: 09/10/2020   PT End of Session - 09/10/20 1451     Visit Number 7    Number of Visits 21    Date for PT Re-Evaluation 10/23/20    Authorization Type [employer] MCR - FOTO 6th and 10th    PT Start Time 1450    PT Stop Time 1532    PT Time Calculation (min) 42 min    Activity Tolerance Patient tolerated treatment well;No increased pain    Behavior During Therapy [facility] for tasks assessed/performed             Past Medical History:  Diagnosis Date   Anxiety    Cancer ([place])    Prostate and Basal cell on left nose   Diabetes mellitus without complication (HCC)    GERD (gastroesophageal reflux disease)    History of hiatal hernia    Hypertension    Hypothyroidism     Past Surgical History:  Procedure Laterality Date   BACK SURGERY     x 5- first surgery 2006, 2008, 2009 x 2 and 2011   BASAL CELL CARCINOMA EXCISION Left 09/05/2019   Nose   BIOPSY PROSTATE  10/03/2019   EYE SURGERY  2016   cataract surgery   [place] LAMINECTOMY/DECOMPRESSION MICRODISCECTOMY N/A 12/28/2017   Procedure: LAMINECTOMY AND FORAMINOTOMY LUMBAR TWO- LUMBAR THREE, LUMBAR THREE- LUMBAR FOUR, LUMBAR FOUR- LUMBAR FIVE, RIGHT LUMBAR FOUR- LUMBAR FIVE DISKECTOMY;  Surgeon: Newman Pies, MD;  Location: [place];  Service: Neurosurgery;  Laterality: N/A;   POSTERIOR CERVICAL FUSION/FORAMINOTOMY N/A 11/05/2019   Procedure: LAMINECTOMY AND FORAMINOTOMY, INSTRUMENTATION/FUSION CERVICAL TWO CERVICAL THREE, CERVICAL THREE- CERVICAL FOUR;  Surgeon: Newman Pies, MD;  Location: [place];  Service: Neurosurgery;  Laterality: N/A;    There were no vitals  filed for this visit.   Subjective Assessment - 09/10/20 1452     Subjective Pt presents to PT with continued reports of slight L LE pain. He has been compliant with his HEP with no adverse effect. Pt is ready to begin PT at this time.    Pain Score 3     Pain Location Leg    Pain Orientation Left                [facility] PT Assessment - 09/10/20 0001       Observation/Other Assessments   Focus on Therapeutic Outcomes (FOTO)  44% function, 53% predicted                           [facility] Adult PT Treatment/Exercise - 09/10/20 0001       Knee/Hip Exercises: Aerobic   Nustep lvl 6 LE only x 5 min while taking subjective      Knee/Hip Exercises: Machines for Strengthening   Total Gym Leg Press 35lbs 2x10      Knee/Hip Exercises: Seated   Other Seated Knee/Hip Exercises kneeling mini squats 2x10; tall kneeling hip ext; tall kneeling physioball raise 2x10      Knee/Hip Exercises: Supine   Bridges 15 reps    Bridges with Clamshell 2 sets;15 reps   blue tband   Single Leg  Bridge 2 sets;10 reps    Other Supine Knee/Hip Exercises bent knee fallout 2x15 blue tband      Knee/Hip Exercises: Sidelying   Clams 2x10 blue tband ea                      PT Short Term Goals - 09/10/20 1542       PT SHORT TERM GOAL #1   Title Pt will be knowledgeable and compliant with initial HEP for improved carryover and functional ability.    Baseline initial HEP given, reports compliance    Time 3    Period Weeks    Status Achieved    Target Date 09/04/20      PT SHORT TERM GOAL #2   Title PT will perform BERG with appropriate LTG at next session    Baseline 13/56, need to set LTG    Time 2    Period Weeks    Status Partially Met    Target Date 08/28/20               PT Long Term Goals - 09/10/20 1543       PT LONG TERM GOAL #1   Title Pt will improve FOTO to no less than 53% in order to improve balance and confidence    Baseline 45% function; 44% on  7/20    Time 10    Period Weeks    Status On-going    Target Date 10/23/20      PT LONG TERM GOAL #2   Title Pt will improve reps in 30 Sec STS to no less than 8 with improved stability in order to decrease fall risk and improve mobility    Baseline 4 reps - CGA    Time 10    Period Weeks    Status On-going    Target Date 10/23/20      PT LONG TERM GOAL #3   Title Pt will improve TUG to no greater than 15 seconds with preferred AD in order to improve safety and decrease fall risk    Baseline 20 seconds    Time 10    Period Weeks    Status On-going    Target Date 10/23/20                   Plan - 09/10/20 1544     Clinical Impression Statement Pt was able to complete prescribed exercises with no adverse effect. Over the course of PT thus far, he does not feel he has made much progress. His FOTO score decreased from 45% to 44% since evaluation. PT did recently reach out to physician about AFO and advised pt to look into foot drop brace that he can trial in clinic while in PT. Will continue to progress pt as tolerated per POC.    PT Treatment/Interventions ADLs/Self Care Home Management;Electrical Stimulation;Moist Heat;Stair training;Functional mobility training;Therapeutic activities;Gait training;Therapeutic exercise;Neuromuscular re-education;Balance training;Patient/family education;Manual techniques    PT Next Visit Plan continue to progress half kneeling stances; strengthening as able    PT Home Exercise Plan Access Code [facility]             Patient will benefit from skilled therapeutic intervention in order to improve the following deficits and impairments:  Abnormal gait, Decreased activity tolerance, Decreased balance, Decreased endurance, Decreased mobility, Decreased strength, Difficulty walking, Impaired sensation, Impaired tone, Pain  Visit Diagnosis: Muscle weakness (generalized)  Unsteadiness on feet  Other abnormalities of gait and  mobility  Problem List Patient Active Problem List   Diagnosis Date Noted   Myelopathy concurrent with and due to spinal stenosis of cervical region [facility]) 11/05/2019   Lumbar herniated disc 12/28/2017    Ward Chatters, PT, DPT 09/10/20 3:46 PM  [facility] [address] [place], [place], [address] Phone: [phone]   Fax:  [phone]  Name: [name] MRN: [id number] Date of Birth: 1945/11/29

## 2020-09-12 NOTE — Progress Notes (Signed)
Call back Monday, answer machine at 424

## 2020-09-15 ENCOUNTER — Telehealth: Payer: Self-pay | Admitting: Neurology

## 2020-09-15 ENCOUNTER — Other Ambulatory Visit: Payer: Self-pay

## 2020-09-15 ENCOUNTER — Ambulatory Visit: Payer: Medicare PPO

## 2020-09-15 DIAGNOSIS — R2689 Other abnormalities of gait and mobility: Secondary | ICD-10-CM | POA: Diagnosis not present

## 2020-09-15 DIAGNOSIS — R2681 Unsteadiness on feet: Secondary | ICD-10-CM | POA: Diagnosis not present

## 2020-09-15 DIAGNOSIS — M6281 Muscle weakness (generalized): Secondary | ICD-10-CM | POA: Diagnosis not present

## 2020-09-15 NOTE — Telephone Encounter (Signed)
Pt was returning christy's call. [phone]

## 2020-09-15 NOTE — Progress Notes (Signed)
Left message at 09/15/2020 at 8:05 am to return call for EMG.

## 2020-09-15 NOTE — Therapy (Signed)
[place], [place], [address] Phone: [phone]   Fax:  [phone]  Physical Therapy Treatment  Patient Details  Name: [name] MRN: [id number] Date of Birth: November 08, 1945 Referring Provider (PT): Alda Berthold, DO   Encounter Date: 09/15/2020   PT End of Session - 09/15/20 1215     Visit Number 8    Number of Visits 21    Date for PT Re-Evaluation 10/23/20    Authorization Type [employer] MCR - FOTO 6th and 10th    PT Start Time 1215    PT Stop Time 1255    PT Time Calculation (min) 40 min    Activity Tolerance Patient tolerated treatment well;No increased pain    Behavior During Therapy [facility] for tasks assessed/performed             Past Medical History:  Diagnosis Date   Anxiety    Cancer ([place])    Prostate and Basal cell on left nose   Diabetes mellitus without complication (HCC)    GERD (gastroesophageal reflux disease)    History of hiatal hernia    Hypertension    Hypothyroidism     Past Surgical History:  Procedure Laterality Date   BACK SURGERY     x 5- first surgery 2006, 2008, 2009 x 2 and 2011   BASAL CELL CARCINOMA EXCISION Left 09/05/2019   Nose   BIOPSY PROSTATE  10/03/2019   EYE SURGERY  2016   cataract surgery   [place] LAMINECTOMY/DECOMPRESSION MICRODISCECTOMY N/A 12/28/2017   Procedure: LAMINECTOMY AND FORAMINOTOMY LUMBAR TWO- LUMBAR THREE, LUMBAR THREE- LUMBAR FOUR, LUMBAR FOUR- LUMBAR FIVE, RIGHT LUMBAR FOUR- LUMBAR FIVE DISKECTOMY;  Surgeon: Newman Pies, MD;  Location: [place];  Service: Neurosurgery;  Laterality: N/A;   POSTERIOR CERVICAL FUSION/FORAMINOTOMY N/A 11/05/2019   Procedure: LAMINECTOMY AND FORAMINOTOMY, INSTRUMENTATION/FUSION CERVICAL TWO CERVICAL THREE, CERVICAL THREE- CERVICAL FOUR;  Surgeon: Newman Pies, MD;  Location: [place];  Service: Neurosurgery;  Laterality: N/A;    There were no vitals  filed for this visit.   Subjective Assessment - 09/15/20 1215     Subjective Pt presents to PT with continued reports of lower back and R ankle pain. He was able to order foot drop ankle brace with trial today. Pt is ready to begin PT treatment at this time.    Currently in Pain? Yes    Pain Score 3     Pain Location Ankle    Pain Orientation Right                               [facility] Adult PT Treatment/Exercise - 09/15/20 0001       Knee/Hip Exercises: Aerobic   Nustep lvl 5 x 3 min LE only while taking subjective      Knee/Hip Exercises: Machines for Strengthening   Total Gym Leg Press 35lbs 2x10    Other Machine single leg press 2x10 35lbs      Knee/Hip Exercises: Seated   Other Seated Knee/Hip Exercises kneeling mini squats 2x10; tall kneeling physioball raise 2x15; half kneeling alt arm raise x 10 ea      Knee/Hip Exercises: Supine   Bridges 15 reps    Bridges with Clamshell 2 sets;15 reps   blue tband   Other Supine Knee/Hip Exercises bent knee fallout 2x15 blue tband  Knee/Hip Exercises: Sidelying   Hip ABduction 10 reps    Clams 2x10 blue tband ea      Ankle Exercises: Supine   T-Band R ankle DF/PF 2x15 yellow tband                      PT Short Term Goals - 09/10/20 1542       PT SHORT TERM GOAL #1   Title Pt will be knowledgeable and compliant with initial HEP for improved carryover and functional ability.    Baseline initial HEP given, reports compliance    Time 3    Period Weeks    Status Achieved    Target Date 09/04/20      PT SHORT TERM GOAL #2   Title PT will perform BERG with appropriate LTG at next session    Baseline 13/56, need to set LTG    Time 2    Period Weeks    Status Partially Met    Target Date 08/28/20               PT Long Term Goals - 09/10/20 1543       PT LONG TERM GOAL #1   Title Pt will improve FOTO to no less than 53% in order to improve balance and confidence    Baseline 45%  function; 44% on 7/20    Time 10    Period Weeks    Status On-going    Target Date 10/23/20      PT LONG TERM GOAL #2   Title Pt will improve reps in 30 Sec STS to no less than 8 with improved stability in order to decrease fall risk and improve mobility    Baseline 4 reps - CGA    Time 10    Period Weeks    Status On-going    Target Date 10/23/20      PT LONG TERM GOAL #3   Title Pt will improve TUG to no greater than 15 seconds with preferred AD in order to improve safety and decrease fall risk    Baseline 20 seconds    Time 10    Period Weeks    Status On-going    Target Date 10/23/20                   Plan - 09/15/20 1343     Clinical Impression Statement Pt again complete prescribed exercises with no adverse effect. Trial of ambulation with foot drop lace brace performed well, with PT advising to wear if he is going to be doing any longer distance ambulation. Today's session again focused on increasing strength and balance, with balance in tall/half kneeling positions. Will assess pt goals at next session and determine next steps in current POC.    PT Treatment/Interventions ADLs/Self Care Home Management;Electrical Stimulation;Moist Heat;Stair training;Functional mobility training;Therapeutic activities;Gait training;Therapeutic exercise;Neuromuscular re-education;Balance training;Patient/family education;Manual techniques    PT Next Visit Plan assess goals; progress as able    PT Home Exercise Plan Access Code [facility]             Patient will benefit from skilled therapeutic intervention in order to improve the following deficits and impairments:  Abnormal gait, Decreased activity tolerance, Decreased balance, Decreased endurance, Decreased mobility, Decreased strength, Difficulty walking, Impaired sensation, Impaired tone, Pain  Visit Diagnosis: Muscle weakness (generalized)  Unsteadiness on feet  Other abnormalities of gait and mobility     Problem  List Patient Active Problem List  Diagnosis Date Noted   Myelopathy concurrent with and due to spinal stenosis of cervical region [facility]) 11/05/2019   Lumbar herniated disc 12/28/2017    Ward Chatters, PT, DPT 09/15/20 1:46 PM  [place], [place], [address] Phone: [phone]   Fax:  [phone]  Name: [name] MRN: [id number] Date of Birth: Feb 17, 1946

## 2020-09-15 NOTE — Telephone Encounter (Signed)
Voicemail at [id number]/25/2022

## 2020-09-17 ENCOUNTER — Other Ambulatory Visit: Payer: Self-pay

## 2020-09-17 ENCOUNTER — Ambulatory Visit: Payer: Medicare PPO

## 2020-09-17 DIAGNOSIS — R2689 Other abnormalities of gait and mobility: Secondary | ICD-10-CM

## 2020-09-17 DIAGNOSIS — R2681 Unsteadiness on feet: Secondary | ICD-10-CM | POA: Diagnosis not present

## 2020-09-17 DIAGNOSIS — M6281 Muscle weakness (generalized): Secondary | ICD-10-CM

## 2020-09-17 NOTE — Therapy (Addendum)
[place], [place], [address] Phone: [phone]   Fax:  [phone]  Physical Therapy Treatment/Discharge  Patient Details  Name: [name] MRN: [id number] Date of Birth: 11-08-1945 Referring Provider (PT): Alda Berthold, DO   Encounter Date: 09/17/2020   PT End of Session - 09/17/20 1213     Visit Number 9    Number of Visits 21    Date for PT Re-Evaluation 10/23/20    Authorization Type [employer] MCR - FOTO 6th and 10th    PT Start Time 1215    PT Stop Time 1245    PT Time Calculation (min) 30 min    Activity Tolerance Patient tolerated treatment well;No increased pain    Behavior During Therapy [facility] for tasks assessed/performed             Past Medical History:  Diagnosis Date   Anxiety    Cancer ([place])    Prostate and Basal cell on left nose   Diabetes mellitus without complication (HCC)    GERD (gastroesophageal reflux disease)    History of hiatal hernia    Hypertension    Hypothyroidism     Past Surgical History:  Procedure Laterality Date   BACK SURGERY     x 5- first surgery 2006, 2008, 2009 x 2 and 2011   BASAL CELL CARCINOMA EXCISION Left 09/05/2019   Nose   BIOPSY PROSTATE  10/03/2019   EYE SURGERY  2016   cataract surgery   [place] LAMINECTOMY/DECOMPRESSION MICRODISCECTOMY N/A 12/28/2017   Procedure: LAMINECTOMY AND FORAMINOTOMY LUMBAR TWO- LUMBAR THREE, LUMBAR THREE- LUMBAR FOUR, LUMBAR FOUR- LUMBAR FIVE, RIGHT LUMBAR FOUR- LUMBAR FIVE DISKECTOMY;  Surgeon: Newman Pies, MD;  Location: [place];  Service: Neurosurgery;  Laterality: N/A;   POSTERIOR CERVICAL FUSION/FORAMINOTOMY N/A 11/05/2019   Procedure: LAMINECTOMY AND FORAMINOTOMY, INSTRUMENTATION/FUSION CERVICAL TWO CERVICAL THREE, CERVICAL THREE- CERVICAL FOUR;  Surgeon: Newman Pies, MD;  Location: [place];  Service: Neurosurgery;  Laterality: N/A;    There were  no vitals filed for this visit.   Subjective Assessment - 09/17/20 1213     Subjective Pt presents to PT with continued reports of lower back and R ankle pain. Pt states he does not feel he has had much change over the course of PT treatment. He is ready to begin PT treatment at this time.    Currently in Pain? Yes    Pain Score 3     Pain Location Ankle    Pain Orientation Right                [facility] PT Assessment - 09/17/20 0001       Observation/Other Assessments   Focus on Therapeutic Outcomes (FOTO)  55% function; 53% predicted      Timed Up and Go Test   Normal TUG (seconds) 18                           [facility] Adult PT Treatment/Exercise - 09/17/20 0001       Knee/Hip Exercises: Supine   Bridges 10 reps    Straight Leg Raises 10 reps;1 set    Other Supine Knee/Hip Exercises clamshell black tband x 15      Knee/Hip Exercises: Sidelying   Clams x 10 ea black tband      Ankle Exercises: Seated   Other Seated Ankle Exercises  ankle DF/PF red tband x 10 ea                    PT Education - 09/17/20 1251     Education Details HEP, strategies to reduce fall risk    Person(s) Educated Patient    Methods Explanation;Demonstration;Handout    Comprehension Verbalized understanding;Returned demonstration              PT Short Term Goals - 09/10/20 1542       PT SHORT TERM GOAL #1   Title Pt will be knowledgeable and compliant with initial HEP for improved carryover and functional ability.    Baseline initial HEP given, reports compliance    Time 3    Period Weeks    Status Achieved    Target Date 09/04/20      PT SHORT TERM GOAL #2   Title PT will perform BERG with appropriate LTG at next session    Baseline 13/56, need to set LTG    Time 2    Period Weeks    Status Partially Met    Target Date 08/28/20               PT Long Term Goals - 09/17/20 1247       PT LONG TERM GOAL #1   Title Pt will improve FOTO to no  less than 53% in order to improve balance and confidence    Baseline 45% function; 44% on 7/20; 55% on 09/17/20    Time 10    Period Weeks    Status Achieved      PT LONG TERM GOAL #2   Title Pt will improve reps in 30 Sec STS to no less than 8 with improved stability in order to decrease fall risk and improve mobility    Baseline 4 reps - CGA; 12 reps on 7/27 - continued unsteady transfer status    Time 10    Period Weeks    Status Achieved      PT LONG TERM GOAL #3   Title Pt will improve TUG to no greater than 15 seconds with preferred AD in order to improve safety and decrease fall risk    Baseline 20 seconds;  18 seconds on 09/17/20    Time 10    Period Weeks    Status Partially Met                   Plan - 09/17/20 1251     Clinical Impression Statement Pt was able to complete prescribed exercises and demonstrated knowledge of HEP with no adverse effect. Over the course of PT, pt has been able to improve his strength and functional mobility, as noted by increased reps in 30 Sec STS as well as slight decrease in TUG time. His FOTO score did improve to reach over predicted value, showing subjective improvement in overall functional mobility. Unfortunately, pt continues to have significant deficits in gait and transfers, mostly due to LE weakness and decreased nerve firing rate in distal R LE. He should continue to improve with HEP compliance and has purchased a lace up brace with foot drop prevention. PT educated on use and best practice, along with HEP updates for continued improvement. Pt is being discharged at this time and is in agreement wiht current POC.    PT Treatment/Interventions ADLs/Self Care Home Management;Electrical Stimulation;Moist Heat;Stair training;Functional mobility training;Therapeutic activities;Gait training;Therapeutic exercise;Neuromuscular re-education;Balance training;Patient/family education;Manual techniques    PT Home Exercise Plan  Access Code  [place] and Agree with Plan of Care Patient             Patient will benefit from skilled therapeutic intervention in order to improve the following deficits and impairments:  Abnormal gait, Decreased activity tolerance, Decreased balance, Decreased endurance, Decreased mobility, Decreased strength, Difficulty walking, Impaired sensation, Impaired tone, Pain  Visit Diagnosis: Muscle weakness (generalized)  Unsteadiness on feet  Other abnormalities of gait and mobility     Problem List Patient Active Problem List   Diagnosis Date Noted   Myelopathy concurrent with and due to spinal stenosis of cervical region ([place]) 11/05/2019   Lumbar herniated disc 12/28/2017    Ward Chatters, PT, DPT 09/17/20 1:57 PM  [place] [facility] [address] [place], [place], [address] Phone: [phone]   Fax:  [phone]  Name: [name] MRN: [id number] Date of Birth: Feb 05, 1946  PHYSICAL THERAPY DISCHARGE SUMMARY  Visits from Start of Care: 9  Current functional level related to goals / functional outcomes: See goals and objective   Remaining deficits: See goals and objective   Education / Equipment: HEP   Patient agrees to discharge. Patient goals were partially met. Patient is being discharged due to being pleased with the current functional level.

## 2020-09-17 NOTE — Telephone Encounter (Signed)
[name] was returning christy's call. He said you can call home or cell

## 2020-09-17 NOTE — Telephone Encounter (Signed)
Left message to call back regarding EMG results.

## 2020-09-18 NOTE — Telephone Encounter (Signed)
Patient advised of EMG results.

## 2020-09-18 NOTE — Progress Notes (Signed)
Patient advised of EMG results

## 2020-09-22 DIAGNOSIS — D510 Vitamin B12 deficiency anemia due to intrinsic factor deficiency: Secondary | ICD-10-CM | POA: Diagnosis not present

## 2020-09-22 DIAGNOSIS — Z0000 Encounter for general adult medical examination without abnormal findings: Secondary | ICD-10-CM | POA: Diagnosis not present

## 2020-09-22 DIAGNOSIS — E039 Hypothyroidism, unspecified: Secondary | ICD-10-CM | POA: Diagnosis not present

## 2020-09-22 DIAGNOSIS — F324 Major depressive disorder, single episode, in partial remission: Secondary | ICD-10-CM | POA: Diagnosis not present

## 2020-09-22 DIAGNOSIS — E782 Mixed hyperlipidemia: Secondary | ICD-10-CM | POA: Diagnosis not present

## 2020-09-22 DIAGNOSIS — I10 Essential (primary) hypertension: Secondary | ICD-10-CM | POA: Diagnosis not present

## 2020-09-22 DIAGNOSIS — E538 Deficiency of other specified B group vitamins: Secondary | ICD-10-CM | POA: Diagnosis not present

## 2020-09-22 DIAGNOSIS — E559 Vitamin D deficiency, unspecified: Secondary | ICD-10-CM | POA: Diagnosis not present

## 2020-09-22 DIAGNOSIS — E1140 Type 2 diabetes mellitus with diabetic neuropathy, unspecified: Secondary | ICD-10-CM | POA: Diagnosis not present

## 2020-09-24 DIAGNOSIS — C61 Malignant neoplasm of prostate: Secondary | ICD-10-CM | POA: Diagnosis not present

## 2020-09-29 DIAGNOSIS — E039 Hypothyroidism, unspecified: Secondary | ICD-10-CM | POA: Diagnosis not present

## 2020-09-29 DIAGNOSIS — F324 Major depressive disorder, single episode, in partial remission: Secondary | ICD-10-CM | POA: Diagnosis not present

## 2020-09-29 DIAGNOSIS — I10 Essential (primary) hypertension: Secondary | ICD-10-CM | POA: Diagnosis not present

## 2020-09-29 DIAGNOSIS — E782 Mixed hyperlipidemia: Secondary | ICD-10-CM | POA: Diagnosis not present

## 2020-09-29 DIAGNOSIS — E538 Deficiency of other specified B group vitamins: Secondary | ICD-10-CM | POA: Diagnosis not present

## 2020-09-29 DIAGNOSIS — Z23 Encounter for immunization: Secondary | ICD-10-CM | POA: Diagnosis not present

## 2020-09-29 DIAGNOSIS — E1140 Type 2 diabetes mellitus with diabetic neuropathy, unspecified: Secondary | ICD-10-CM | POA: Diagnosis not present

## 2020-09-29 DIAGNOSIS — E559 Vitamin D deficiency, unspecified: Secondary | ICD-10-CM | POA: Diagnosis not present

## 2020-09-29 DIAGNOSIS — Z0000 Encounter for general adult medical examination without abnormal findings: Secondary | ICD-10-CM | POA: Diagnosis not present

## 2020-10-01 DIAGNOSIS — N400 Enlarged prostate without lower urinary tract symptoms: Secondary | ICD-10-CM | POA: Diagnosis not present

## 2020-10-01 DIAGNOSIS — C61 Malignant neoplasm of prostate: Secondary | ICD-10-CM | POA: Diagnosis not present

## 2020-10-30 ENCOUNTER — Ambulatory Visit: Payer: Medicare PPO | Admitting: Neurology

## 2020-10-30 DIAGNOSIS — Z20822 Contact with and (suspected) exposure to covid-19: Secondary | ICD-10-CM | POA: Diagnosis not present

## 2020-11-10 DIAGNOSIS — E538 Deficiency of other specified B group vitamins: Secondary | ICD-10-CM | POA: Diagnosis not present

## 2020-11-27 ENCOUNTER — Ambulatory Visit: Payer: Medicare PPO | Admitting: Neurology

## 2020-12-10 DIAGNOSIS — E538 Deficiency of other specified B group vitamins: Secondary | ICD-10-CM | POA: Diagnosis not present

## 2020-12-12 ENCOUNTER — Ambulatory Visit: Payer: Medicare PPO | Admitting: Neurology

## 2020-12-29 DIAGNOSIS — H16292 Other keratoconjunctivitis, left eye: Secondary | ICD-10-CM | POA: Diagnosis not present

## 2021-01-02 IMAGING — RF DG C-ARM 1-60 MIN
1 series · 1 of 1 positions shown · non-contrast
Comparison: 05/13/2007

CLINICAL DATA: Posterior cervical fusion C2 through C4.

EXAM:
CERVICAL SPINE - 2-3 VIEW; DG C-ARM 1-60 MIN

[Series 1: run · 1 of 1 slices shown]
[im 1/1]
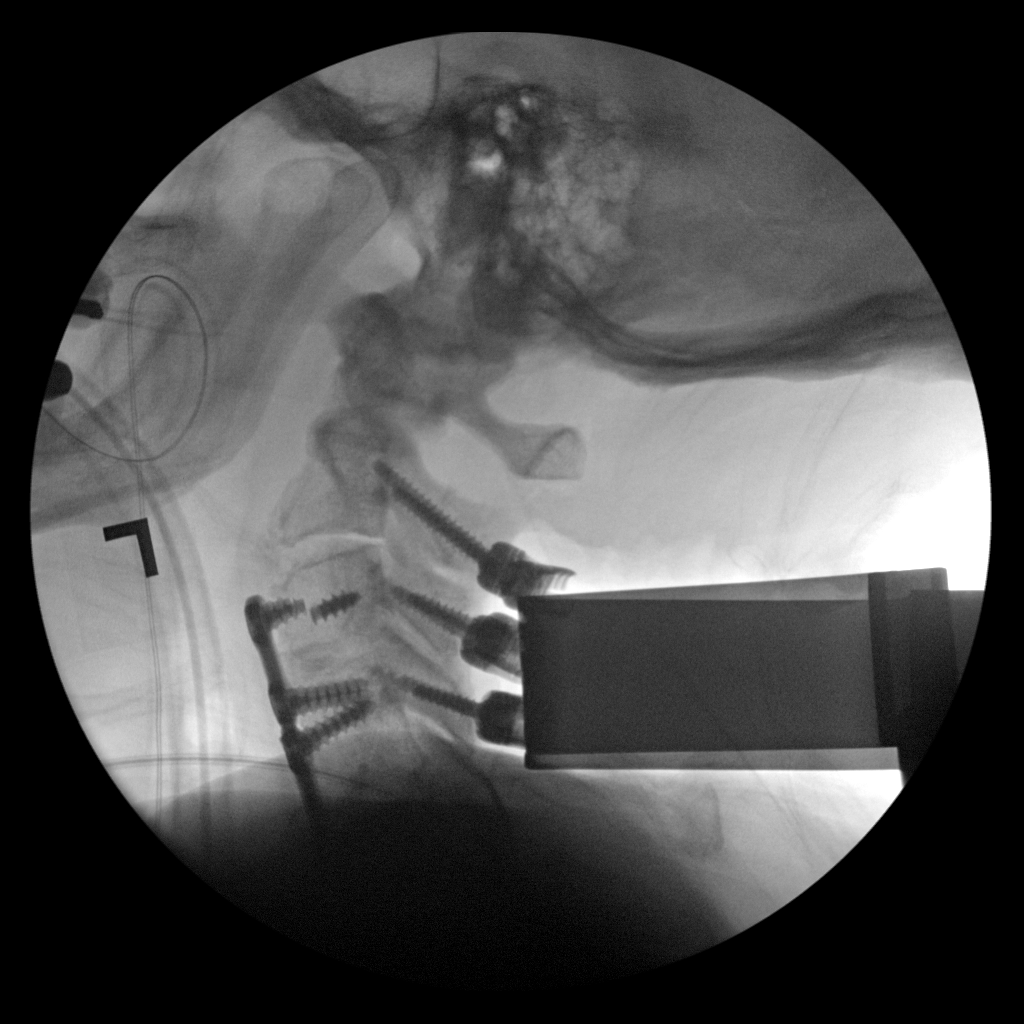

[1 of 1 positions shown; findings below may reference images not displayed]

FINDINGS: Single lateral C-arm image of the cervical spine is obtained in the
operating room. Interval placement of posterior screws C2, C3, and
C4 in satisfactory position. Posterior rods not in place

ACDF C3, C4 and C5. Anatomy not visualized below the C4-5 level.
Fracture of both anterior C3 screws.
IMPRESSION: Interval placement of pedicle screws at C3, C4, C5.

## 2021-01-07 DIAGNOSIS — E538 Deficiency of other specified B group vitamins: Secondary | ICD-10-CM | POA: Diagnosis not present

## 2021-01-20 DIAGNOSIS — E1142 Type 2 diabetes mellitus with diabetic polyneuropathy: Secondary | ICD-10-CM | POA: Diagnosis not present

## 2021-01-30 ENCOUNTER — Other Ambulatory Visit: Payer: Self-pay

## 2021-01-30 ENCOUNTER — Ambulatory Visit: Payer: Medicare PPO | Admitting: Neurology

## 2021-01-30 ENCOUNTER — Encounter: Payer: Self-pay | Admitting: Neurology

## 2021-01-30 VITALS — BP 115/52 | HR 90 | Ht 68.0 in | Wt 185.0 lb

## 2021-01-30 DIAGNOSIS — M21371 Foot drop, right foot: Secondary | ICD-10-CM | POA: Diagnosis not present

## 2021-01-30 DIAGNOSIS — M5417 Radiculopathy, lumbosacral region: Secondary | ICD-10-CM

## 2021-01-30 DIAGNOSIS — E1140 Type 2 diabetes mellitus with diabetic neuropathy, unspecified: Secondary | ICD-10-CM | POA: Diagnosis not present

## 2021-01-30 DIAGNOSIS — Z794 Long term (current) use of insulin: Secondary | ICD-10-CM

## 2021-01-30 NOTE — Patient Instructions (Signed)
If you would like a ankle foot brace, please contact my office  Return to clinic in if your symptoms get worse

## 2021-01-30 NOTE — Progress Notes (Signed)
Follow-up Visit   Date: 01/30/21   TU BAYLE MRN: [id number] DOB: Dec 03, 1945   Interim History: [name] is a 75 y.o. right-handed Caucasian male with diabetes mellitus, hypothyroidism, hypertension, prostate cancer, s/p lumbar surgery x 5, s/p cervical surgery, and GERD returning to the clinic for follow-up of neuropathy and foot drop.  The patient was accompanied to the clinic by self.  History of present illness: Starting around 2019, he began having imbalance and difficulty walking.  He has been using a walker since early 2022.  He has fallen twice over the past year.  He has tingling in the soles of the feet.  He also has weakness in the feet, especially the right foot.  He has multiple back surgeries, the last in 2019 by Dr. Arnoldo Morale, after MRI showed right L2-3 and L3-4 spinal stenosis, right L4-5 herniated disc.  In 2021, he underwent C2-3 and C3-4 laminectomy and fusion for cervical myelopathy.    He is retired from [employer].  Lives in one-level home with his wife.   UPDATE 01/30/2021:  He is here for follow-up visit. NCS/EMG was performed over the summer which shows severe sensorimotor neuropathy with superimposed multilevel radiculopathies involving the right L3-S1 radiculopathy and left L5-S1 radiculopathy.  He completed PT which did not help his foot drop. Symptoms overall remain unchanged. No new weakness, pain, or falls.  He has seen Dr. Arnoldo Morale who recommends supportive care.   Medications:  Current Outpatient Medications on File Prior to Visit  Medication Sig Dispense Refill   amLODipine (NORVASC) 10 MG tablet Take 10 mg by mouth daily.      aspirin EC 81 MG tablet Take 81 mg by mouth daily.     atorvastatin (LIPITOR) 80 MG tablet Take 80 mg by mouth daily.     carvedilol (COREG) 25 MG tablet Take 25 mg by mouth 2 (two) times daily.  4   Cholecalciferol (VITAMIN D3) 2000 units TABS Take 2,000 Units by mouth daily.     Cyanocobalamin (VITAMIN B-12  IJ) Inject 1,000 mcg as directed every 30 (thirty) days.     Exenatide ER (BYDUREON) 2 MG PEN Inject 2 mg into the skin every 7 (seven) days.     irbesartan (AVAPRO) 300 MG tablet Take 300 mg by mouth daily.  4   lactobacillus acidophilus (BACID) TABS tablet Take 1 tablet by mouth daily.     LANTUS SOLOSTAR 100 UNIT/ML Solostar Pen Inject 50 Units into the skin at bedtime.   4   levothyroxine (SYNTHROID, LEVOTHROID) 100 MCG tablet Take 100 mcg by mouth daily before breakfast.     metFORMIN (GLUCOPHAGE-XR) 500 MG 24 hr tablet Take 500-1,000 mg by mouth See admin instructions. Take 1000 mg by mouth in the morning and 500 mg in the evening  4   niacin 500 MG tablet Take 500 mg by mouth at bedtime.     omeprazole (PRILOSEC) 20 MG capsule Take 20 mg by mouth every other day.     potassium chloride SA (K-DUR,KLOR-CON) 20 MEQ tablet Take 20 mEq by mouth 2 (two) times daily.     sertraline (ZOLOFT) 100 MG tablet Take 100 mg by mouth at bedtime.     No current facility-administered medications on file prior to visit.    Allergies:  Allergies  Allergen Reactions   Codeine Nausea Only   Morphine And Related Other (See Comments)    Alters heart rate    Vital Signs:  There were no vitals taken  for this visit.    Neurological Exam: MENTAL STATUS including orientation to time, place, person, recent and remote memory, attention span and concentration, language, and fund of knowledge is normal.  Speech is not dysarthric.  CRANIAL NERVES:  No visual field defects.  Pupils equal round and reactive to light.  Normal conjugate, extra-ocular eye movements in all directions of gaze.  No ptosis.    MOTOR: Mild R > L TA atrophy, no fasciculations or abnormal movements.  No pronator drift.    Upper Extremity:  Right   Left  Deltoid  5/5    5/5   Biceps  5/5    5/5   Triceps  5/5    5/5   Infraspinatus 5/5   5/5  Medial pectoralis 5/5   5/5  Wrist extensors  5/5    5/5   Wrist flexors  5/5    5/5    Finger extensors  5/5    5/5   Finger flexors  5/5    5/5   Dorsal interossei  5/5    5/5   Abductor pollicis  5/5    5/5   Tone (Ashworth scale)  0   0    Lower Extremity:  Right   Left  Hip flexors  5/5    5/5   Hip extensors  5/5    5/5   Adductor 5/5   5/5  Abductor 5/5   5/5  Knee flexors  5/5    5/5   Knee extensors  5/5    5/5   Dorsiflexors  3/5    5/5   Plantarflexors  3/5    4/5   Toe extensors  2/5    3/5   Toe flexors  2/5    3/5   Tone (Ashworth scale)  0   0    MSRs:    Right        Left                  brachioradialis 2+   2+  biceps 2+   2+  triceps 2+   2+  patellar 0   2+  ankle jerk 0   0  Hoffman no   no  plantar response down   down    SENSORY:  Vibration is absent below the ankles, temperature reduced at the feet, worse over the right leg.      COORDINATION/GAIT: Normal finger-to- nose-finger.  Gait wide-based with steppage on the right, assisted with walker  Data: MRI lumbar spine 12/03/2017: The dominant finding is at L4-5 where a central and rightward extrusion is observed with posterior element hypertrophy. Severe, near critical stenosis. RIGHT greater than LEFT L4 and L5 neural impingement. Mild to moderate stenosis at L2-L3, with moderate to severe stenosis at L3-4, as described. Potentially symptomatic LEFT-sided neural impingement due to large disc extrusion L1-L2.  NCS/EMG of the legs 09/09/2020: The electrophysiologic findings are consistent with a chronic sensorimotor axonal polyneuropathy affecting the lower extremities, very severe. There is evidence of superimposed chronic right L3-S1 radiculopathy and left L5-S1 radiculopathy.   Lab Results  Component Value Date   HGBA1C 7.8 (H) 11/01/2019    IMPRESSION/PLAN: Severe diabetic peripheral neuropathy manifesting with distal sensory loss and bilateral foot drop, worse on the right.  The asymmetry of weakness suggests overlapping chronic multilevel lumbosacral radiculopathies,as  confirmed by EMG.  He is s/p lumbar surgery x 5 by Dr. Arnoldo Morale Unfortunately, there is no treatment to reverse neuropathy and  management is supportive He has completed PT Right AFO was offered, he has braces at home which he prefers to try to use He denies pain, as symptoms are predominately numbness and weakness Patient educated on daily foot inspection, fall prevention, and safety precautions around the home.  Return to clinic as needed  Thank you for allowing me to participate in patient's care.  If I can answer any additional questions, I would be pleased to do so.    Sincerely,    Jaedyn Lard K. Posey Pronto, DO

## 2021-02-01 ENCOUNTER — Encounter: Payer: Self-pay | Admitting: Neurology

## 2021-02-11 DIAGNOSIS — E538 Deficiency of other specified B group vitamins: Secondary | ICD-10-CM | POA: Diagnosis not present

## 2021-02-26 DIAGNOSIS — E119 Type 2 diabetes mellitus without complications: Secondary | ICD-10-CM | POA: Diagnosis not present

## 2021-02-26 DIAGNOSIS — Z7984 Long term (current) use of oral hypoglycemic drugs: Secondary | ICD-10-CM | POA: Diagnosis not present

## 2021-02-26 DIAGNOSIS — H52203 Unspecified astigmatism, bilateral: Secondary | ICD-10-CM | POA: Diagnosis not present

## 2021-02-26 DIAGNOSIS — Z794 Long term (current) use of insulin: Secondary | ICD-10-CM | POA: Diagnosis not present

## 2021-02-26 DIAGNOSIS — H04123 Dry eye syndrome of bilateral lacrimal glands: Secondary | ICD-10-CM | POA: Diagnosis not present

## 2021-02-26 DIAGNOSIS — Z961 Presence of intraocular lens: Secondary | ICD-10-CM | POA: Diagnosis not present

## 2021-02-26 DIAGNOSIS — H524 Presbyopia: Secondary | ICD-10-CM | POA: Diagnosis not present

## 2021-02-26 DIAGNOSIS — H5201 Hypermetropia, right eye: Secondary | ICD-10-CM | POA: Diagnosis not present

## 2021-03-05 DIAGNOSIS — M21371 Foot drop, right foot: Secondary | ICD-10-CM | POA: Diagnosis not present

## 2021-03-11 DIAGNOSIS — E538 Deficiency of other specified B group vitamins: Secondary | ICD-10-CM | POA: Diagnosis not present

## 2021-03-25 DIAGNOSIS — C61 Malignant neoplasm of prostate: Secondary | ICD-10-CM | POA: Diagnosis not present

## 2021-03-30 DIAGNOSIS — N400 Enlarged prostate without lower urinary tract symptoms: Secondary | ICD-10-CM | POA: Diagnosis not present

## 2021-03-30 DIAGNOSIS — C61 Malignant neoplasm of prostate: Secondary | ICD-10-CM | POA: Diagnosis not present

## 2021-04-02 DIAGNOSIS — Z794 Long term (current) use of insulin: Secondary | ICD-10-CM | POA: Diagnosis not present

## 2021-04-02 DIAGNOSIS — E782 Mixed hyperlipidemia: Secondary | ICD-10-CM | POA: Diagnosis not present

## 2021-04-02 DIAGNOSIS — E1140 Type 2 diabetes mellitus with diabetic neuropathy, unspecified: Secondary | ICD-10-CM | POA: Diagnosis not present

## 2021-04-02 DIAGNOSIS — E538 Deficiency of other specified B group vitamins: Secondary | ICD-10-CM | POA: Diagnosis not present

## 2021-04-02 DIAGNOSIS — I10 Essential (primary) hypertension: Secondary | ICD-10-CM | POA: Diagnosis not present

## 2021-04-02 DIAGNOSIS — D649 Anemia, unspecified: Secondary | ICD-10-CM | POA: Diagnosis not present

## 2021-04-08 DIAGNOSIS — E538 Deficiency of other specified B group vitamins: Secondary | ICD-10-CM | POA: Diagnosis not present

## 2021-04-22 DIAGNOSIS — I739 Peripheral vascular disease, unspecified: Secondary | ICD-10-CM | POA: Diagnosis not present

## 2021-04-22 DIAGNOSIS — L603 Nail dystrophy: Secondary | ICD-10-CM | POA: Diagnosis not present

## 2021-04-22 DIAGNOSIS — E1142 Type 2 diabetes mellitus with diabetic polyneuropathy: Secondary | ICD-10-CM | POA: Diagnosis not present

## 2021-05-06 DIAGNOSIS — E538 Deficiency of other specified B group vitamins: Secondary | ICD-10-CM | POA: Diagnosis not present

## 2021-06-05 DIAGNOSIS — Z20822 Contact with and (suspected) exposure to covid-19: Secondary | ICD-10-CM | POA: Diagnosis not present

## 2021-06-06 DIAGNOSIS — J029 Acute pharyngitis, unspecified: Secondary | ICD-10-CM | POA: Diagnosis not present

## 2021-06-06 DIAGNOSIS — J069 Acute upper respiratory infection, unspecified: Secondary | ICD-10-CM | POA: Diagnosis not present

## 2021-06-08 DIAGNOSIS — E538 Deficiency of other specified B group vitamins: Secondary | ICD-10-CM | POA: Diagnosis not present

## 2021-06-09 DIAGNOSIS — L821 Other seborrheic keratosis: Secondary | ICD-10-CM | POA: Diagnosis not present

## 2021-06-09 DIAGNOSIS — Z85828 Personal history of other malignant neoplasm of skin: Secondary | ICD-10-CM | POA: Diagnosis not present

## 2021-06-09 DIAGNOSIS — L570 Actinic keratosis: Secondary | ICD-10-CM | POA: Diagnosis not present

## 2021-06-09 DIAGNOSIS — Z08 Encounter for follow-up examination after completed treatment for malignant neoplasm: Secondary | ICD-10-CM | POA: Diagnosis not present

## 2021-06-09 DIAGNOSIS — L814 Other melanin hyperpigmentation: Secondary | ICD-10-CM | POA: Diagnosis not present

## 2021-06-09 DIAGNOSIS — L820 Inflamed seborrheic keratosis: Secondary | ICD-10-CM | POA: Diagnosis not present

## 2021-06-09 DIAGNOSIS — L84 Corns and callosities: Secondary | ICD-10-CM | POA: Diagnosis not present

## 2021-06-09 DIAGNOSIS — D225 Melanocytic nevi of trunk: Secondary | ICD-10-CM | POA: Diagnosis not present

## 2021-06-09 DIAGNOSIS — L538 Other specified erythematous conditions: Secondary | ICD-10-CM | POA: Diagnosis not present

## 2021-06-24 DIAGNOSIS — J3489 Other specified disorders of nose and nasal sinuses: Secondary | ICD-10-CM | POA: Diagnosis not present

## 2021-06-24 DIAGNOSIS — R059 Cough, unspecified: Secondary | ICD-10-CM | POA: Diagnosis not present

## 2021-06-24 DIAGNOSIS — J019 Acute sinusitis, unspecified: Secondary | ICD-10-CM | POA: Diagnosis not present

## 2021-06-24 DIAGNOSIS — Z6830 Body mass index (BMI) 30.0-30.9, adult: Secondary | ICD-10-CM | POA: Diagnosis not present

## 2021-07-13 DIAGNOSIS — E538 Deficiency of other specified B group vitamins: Secondary | ICD-10-CM | POA: Diagnosis not present

## 2021-07-22 DIAGNOSIS — E1142 Type 2 diabetes mellitus with diabetic polyneuropathy: Secondary | ICD-10-CM | POA: Diagnosis not present

## 2021-08-12 DIAGNOSIS — E538 Deficiency of other specified B group vitamins: Secondary | ICD-10-CM | POA: Diagnosis not present

## 2021-09-03 DIAGNOSIS — M48062 Spinal stenosis, lumbar region with neurogenic claudication: Secondary | ICD-10-CM | POA: Diagnosis not present

## 2021-09-03 DIAGNOSIS — M4712 Other spondylosis with myelopathy, cervical region: Secondary | ICD-10-CM | POA: Diagnosis not present

## 2021-09-16 DIAGNOSIS — E538 Deficiency of other specified B group vitamins: Secondary | ICD-10-CM | POA: Diagnosis not present

## 2021-09-21 DIAGNOSIS — C61 Malignant neoplasm of prostate: Secondary | ICD-10-CM | POA: Diagnosis not present

## 2021-09-28 DIAGNOSIS — C61 Malignant neoplasm of prostate: Secondary | ICD-10-CM | POA: Diagnosis not present

## 2021-09-28 DIAGNOSIS — N400 Enlarged prostate without lower urinary tract symptoms: Secondary | ICD-10-CM | POA: Diagnosis not present

## 2021-09-30 DIAGNOSIS — L309 Dermatitis, unspecified: Secondary | ICD-10-CM | POA: Diagnosis not present

## 2021-10-07 DIAGNOSIS — E1140 Type 2 diabetes mellitus with diabetic neuropathy, unspecified: Secondary | ICD-10-CM | POA: Diagnosis not present

## 2021-10-07 DIAGNOSIS — D72828 Other elevated white blood cell count: Secondary | ICD-10-CM | POA: Diagnosis not present

## 2021-10-07 DIAGNOSIS — E782 Mixed hyperlipidemia: Secondary | ICD-10-CM | POA: Diagnosis not present

## 2021-10-07 DIAGNOSIS — E538 Deficiency of other specified B group vitamins: Secondary | ICD-10-CM | POA: Diagnosis not present

## 2021-10-07 DIAGNOSIS — I10 Essential (primary) hypertension: Secondary | ICD-10-CM | POA: Diagnosis not present

## 2021-10-07 DIAGNOSIS — E559 Vitamin D deficiency, unspecified: Secondary | ICD-10-CM | POA: Diagnosis not present

## 2021-10-13 DIAGNOSIS — K219 Gastro-esophageal reflux disease without esophagitis: Secondary | ICD-10-CM | POA: Diagnosis not present

## 2021-10-13 DIAGNOSIS — F324 Major depressive disorder, single episode, in partial remission: Secondary | ICD-10-CM | POA: Diagnosis not present

## 2021-10-13 DIAGNOSIS — Z794 Long term (current) use of insulin: Secondary | ICD-10-CM | POA: Diagnosis not present

## 2021-10-13 DIAGNOSIS — E1140 Type 2 diabetes mellitus with diabetic neuropathy, unspecified: Secondary | ICD-10-CM | POA: Diagnosis not present

## 2021-10-13 DIAGNOSIS — Z0000 Encounter for general adult medical examination without abnormal findings: Secondary | ICD-10-CM | POA: Diagnosis not present

## 2021-10-13 DIAGNOSIS — I10 Essential (primary) hypertension: Secondary | ICD-10-CM | POA: Diagnosis not present

## 2021-10-13 DIAGNOSIS — E039 Hypothyroidism, unspecified: Secondary | ICD-10-CM | POA: Diagnosis not present

## 2021-10-13 DIAGNOSIS — D510 Vitamin B12 deficiency anemia due to intrinsic factor deficiency: Secondary | ICD-10-CM | POA: Diagnosis not present

## 2021-10-13 DIAGNOSIS — G629 Polyneuropathy, unspecified: Secondary | ICD-10-CM | POA: Diagnosis not present

## 2021-10-13 DIAGNOSIS — E538 Deficiency of other specified B group vitamins: Secondary | ICD-10-CM | POA: Diagnosis not present

## 2021-11-19 DIAGNOSIS — E538 Deficiency of other specified B group vitamins: Secondary | ICD-10-CM | POA: Diagnosis not present

## 2021-12-17 DIAGNOSIS — E538 Deficiency of other specified B group vitamins: Secondary | ICD-10-CM | POA: Diagnosis not present

## 2022-01-18 DIAGNOSIS — E538 Deficiency of other specified B group vitamins: Secondary | ICD-10-CM | POA: Diagnosis not present

## 2022-02-17 DIAGNOSIS — E538 Deficiency of other specified B group vitamins: Secondary | ICD-10-CM | POA: Diagnosis not present

## 2022-03-02 DIAGNOSIS — H524 Presbyopia: Secondary | ICD-10-CM | POA: Diagnosis not present

## 2022-03-02 DIAGNOSIS — Z7984 Long term (current) use of oral hypoglycemic drugs: Secondary | ICD-10-CM | POA: Diagnosis not present

## 2022-03-02 DIAGNOSIS — E119 Type 2 diabetes mellitus without complications: Secondary | ICD-10-CM | POA: Diagnosis not present

## 2022-03-02 DIAGNOSIS — H04123 Dry eye syndrome of bilateral lacrimal glands: Secondary | ICD-10-CM | POA: Diagnosis not present

## 2022-03-02 DIAGNOSIS — Z961 Presence of intraocular lens: Secondary | ICD-10-CM | POA: Diagnosis not present

## 2022-03-02 DIAGNOSIS — Z794 Long term (current) use of insulin: Secondary | ICD-10-CM | POA: Diagnosis not present

## 2022-03-02 DIAGNOSIS — H52203 Unspecified astigmatism, bilateral: Secondary | ICD-10-CM | POA: Diagnosis not present

## 2022-03-09 DIAGNOSIS — M4712 Other spondylosis with myelopathy, cervical region: Secondary | ICD-10-CM | POA: Diagnosis not present

## 2022-03-22 DIAGNOSIS — E538 Deficiency of other specified B group vitamins: Secondary | ICD-10-CM | POA: Diagnosis not present

## 2022-03-29 DIAGNOSIS — C61 Malignant neoplasm of prostate: Secondary | ICD-10-CM | POA: Diagnosis not present

## 2022-04-05 DIAGNOSIS — C61 Malignant neoplasm of prostate: Secondary | ICD-10-CM | POA: Diagnosis not present

## 2022-04-05 DIAGNOSIS — N400 Enlarged prostate without lower urinary tract symptoms: Secondary | ICD-10-CM | POA: Diagnosis not present

## 2022-04-21 DIAGNOSIS — C61 Malignant neoplasm of prostate: Secondary | ICD-10-CM | POA: Diagnosis not present

## 2022-04-21 DIAGNOSIS — E538 Deficiency of other specified B group vitamins: Secondary | ICD-10-CM | POA: Diagnosis not present

## 2022-04-21 DIAGNOSIS — E1140 Type 2 diabetes mellitus with diabetic neuropathy, unspecified: Secondary | ICD-10-CM | POA: Diagnosis not present

## 2022-04-21 DIAGNOSIS — Z6828 Body mass index (BMI) 28.0-28.9, adult: Secondary | ICD-10-CM | POA: Diagnosis not present

## 2022-04-21 DIAGNOSIS — E162 Hypoglycemia, unspecified: Secondary | ICD-10-CM | POA: Diagnosis not present

## 2022-04-21 DIAGNOSIS — Z794 Long term (current) use of insulin: Secondary | ICD-10-CM | POA: Diagnosis not present

## 2022-04-21 DIAGNOSIS — E039 Hypothyroidism, unspecified: Secondary | ICD-10-CM | POA: Diagnosis not present

## 2022-05-06 DIAGNOSIS — L603 Nail dystrophy: Secondary | ICD-10-CM | POA: Diagnosis not present

## 2022-05-06 DIAGNOSIS — E1142 Type 2 diabetes mellitus with diabetic polyneuropathy: Secondary | ICD-10-CM | POA: Diagnosis not present

## 2022-05-06 DIAGNOSIS — E1151 Type 2 diabetes mellitus with diabetic peripheral angiopathy without gangrene: Secondary | ICD-10-CM | POA: Diagnosis not present

## 2022-05-06 DIAGNOSIS — I739 Peripheral vascular disease, unspecified: Secondary | ICD-10-CM | POA: Diagnosis not present

## 2022-05-20 DIAGNOSIS — E538 Deficiency of other specified B group vitamins: Secondary | ICD-10-CM | POA: Diagnosis not present

## 2022-06-18 DIAGNOSIS — E538 Deficiency of other specified B group vitamins: Secondary | ICD-10-CM | POA: Diagnosis not present

## 2022-06-18 DIAGNOSIS — L814 Other melanin hyperpigmentation: Secondary | ICD-10-CM | POA: Diagnosis not present

## 2022-06-18 DIAGNOSIS — D225 Melanocytic nevi of trunk: Secondary | ICD-10-CM | POA: Diagnosis not present

## 2022-06-18 DIAGNOSIS — L298 Other pruritus: Secondary | ICD-10-CM | POA: Diagnosis not present

## 2022-06-18 DIAGNOSIS — I872 Venous insufficiency (chronic) (peripheral): Secondary | ICD-10-CM | POA: Diagnosis not present

## 2022-06-18 DIAGNOSIS — L821 Other seborrheic keratosis: Secondary | ICD-10-CM | POA: Diagnosis not present

## 2022-07-20 DIAGNOSIS — E538 Deficiency of other specified B group vitamins: Secondary | ICD-10-CM | POA: Diagnosis not present

## 2022-08-06 DIAGNOSIS — L603 Nail dystrophy: Secondary | ICD-10-CM | POA: Diagnosis not present

## 2022-08-06 DIAGNOSIS — I739 Peripheral vascular disease, unspecified: Secondary | ICD-10-CM | POA: Diagnosis not present

## 2022-08-06 DIAGNOSIS — E1151 Type 2 diabetes mellitus with diabetic peripheral angiopathy without gangrene: Secondary | ICD-10-CM | POA: Diagnosis not present

## 2022-08-06 DIAGNOSIS — E1142 Type 2 diabetes mellitus with diabetic polyneuropathy: Secondary | ICD-10-CM | POA: Diagnosis not present

## 2022-08-19 DIAGNOSIS — E538 Deficiency of other specified B group vitamins: Secondary | ICD-10-CM | POA: Diagnosis not present

## 2022-09-20 DIAGNOSIS — E538 Deficiency of other specified B group vitamins: Secondary | ICD-10-CM | POA: Diagnosis not present

## 2022-09-27 DIAGNOSIS — C61 Malignant neoplasm of prostate: Secondary | ICD-10-CM | POA: Diagnosis not present

## 2022-10-04 DIAGNOSIS — C61 Malignant neoplasm of prostate: Secondary | ICD-10-CM | POA: Diagnosis not present

## 2022-10-04 DIAGNOSIS — R972 Elevated prostate specific antigen [PSA]: Secondary | ICD-10-CM | POA: Diagnosis not present

## 2022-10-20 DIAGNOSIS — E538 Deficiency of other specified B group vitamins: Secondary | ICD-10-CM | POA: Diagnosis not present

## 2022-10-28 DIAGNOSIS — E1140 Type 2 diabetes mellitus with diabetic neuropathy, unspecified: Secondary | ICD-10-CM | POA: Diagnosis not present

## 2022-10-28 DIAGNOSIS — E538 Deficiency of other specified B group vitamins: Secondary | ICD-10-CM | POA: Diagnosis not present

## 2022-10-28 DIAGNOSIS — Z125 Encounter for screening for malignant neoplasm of prostate: Secondary | ICD-10-CM | POA: Diagnosis not present

## 2022-10-28 DIAGNOSIS — E782 Mixed hyperlipidemia: Secondary | ICD-10-CM | POA: Diagnosis not present

## 2022-10-28 DIAGNOSIS — E559 Vitamin D deficiency, unspecified: Secondary | ICD-10-CM | POA: Diagnosis not present

## 2022-10-28 DIAGNOSIS — I10 Essential (primary) hypertension: Secondary | ICD-10-CM | POA: Diagnosis not present

## 2022-11-03 DIAGNOSIS — E559 Vitamin D deficiency, unspecified: Secondary | ICD-10-CM | POA: Diagnosis not present

## 2022-11-03 DIAGNOSIS — K219 Gastro-esophageal reflux disease without esophagitis: Secondary | ICD-10-CM | POA: Diagnosis not present

## 2022-11-03 DIAGNOSIS — E1140 Type 2 diabetes mellitus with diabetic neuropathy, unspecified: Secondary | ICD-10-CM | POA: Diagnosis not present

## 2022-11-03 DIAGNOSIS — E782 Mixed hyperlipidemia: Secondary | ICD-10-CM | POA: Diagnosis not present

## 2022-11-03 DIAGNOSIS — Z6829 Body mass index (BMI) 29.0-29.9, adult: Secondary | ICD-10-CM | POA: Diagnosis not present

## 2022-11-03 DIAGNOSIS — I10 Essential (primary) hypertension: Secondary | ICD-10-CM | POA: Diagnosis not present

## 2022-11-03 DIAGNOSIS — F324 Major depressive disorder, single episode, in partial remission: Secondary | ICD-10-CM | POA: Diagnosis not present

## 2022-11-03 DIAGNOSIS — Z0000 Encounter for general adult medical examination without abnormal findings: Secondary | ICD-10-CM | POA: Diagnosis not present

## 2022-11-03 DIAGNOSIS — Z23 Encounter for immunization: Secondary | ICD-10-CM | POA: Diagnosis not present

## 2022-11-10 DIAGNOSIS — I739 Peripheral vascular disease, unspecified: Secondary | ICD-10-CM | POA: Diagnosis not present

## 2022-11-10 DIAGNOSIS — L603 Nail dystrophy: Secondary | ICD-10-CM | POA: Diagnosis not present

## 2022-11-10 DIAGNOSIS — E1142 Type 2 diabetes mellitus with diabetic polyneuropathy: Secondary | ICD-10-CM | POA: Diagnosis not present

## 2022-11-10 DIAGNOSIS — E1151 Type 2 diabetes mellitus with diabetic peripheral angiopathy without gangrene: Secondary | ICD-10-CM | POA: Diagnosis not present

## 2022-11-17 DIAGNOSIS — E538 Deficiency of other specified B group vitamins: Secondary | ICD-10-CM | POA: Diagnosis not present

## 2022-12-17 DIAGNOSIS — E538 Deficiency of other specified B group vitamins: Secondary | ICD-10-CM | POA: Diagnosis not present

## 2023-01-19 DIAGNOSIS — E538 Deficiency of other specified B group vitamins: Secondary | ICD-10-CM | POA: Diagnosis not present

## 2023-01-31 DIAGNOSIS — J069 Acute upper respiratory infection, unspecified: Secondary | ICD-10-CM | POA: Diagnosis not present

## 2023-01-31 DIAGNOSIS — J329 Chronic sinusitis, unspecified: Secondary | ICD-10-CM | POA: Diagnosis not present

## 2023-02-09 DIAGNOSIS — L603 Nail dystrophy: Secondary | ICD-10-CM | POA: Diagnosis not present

## 2023-02-09 DIAGNOSIS — E1142 Type 2 diabetes mellitus with diabetic polyneuropathy: Secondary | ICD-10-CM | POA: Diagnosis not present

## 2023-02-09 DIAGNOSIS — I739 Peripheral vascular disease, unspecified: Secondary | ICD-10-CM | POA: Diagnosis not present

## 2023-02-09 DIAGNOSIS — E1151 Type 2 diabetes mellitus with diabetic peripheral angiopathy without gangrene: Secondary | ICD-10-CM | POA: Diagnosis not present

## 2023-02-21 DIAGNOSIS — E538 Deficiency of other specified B group vitamins: Secondary | ICD-10-CM | POA: Diagnosis not present

## 2023-02-24 DIAGNOSIS — J069 Acute upper respiratory infection, unspecified: Secondary | ICD-10-CM | POA: Diagnosis not present

## 2023-02-24 DIAGNOSIS — U071 COVID-19: Secondary | ICD-10-CM | POA: Diagnosis not present

## 2023-03-10 DIAGNOSIS — R059 Cough, unspecified: Secondary | ICD-10-CM | POA: Diagnosis not present

## 2023-03-10 DIAGNOSIS — R053 Chronic cough: Secondary | ICD-10-CM | POA: Diagnosis not present

## 2023-03-10 DIAGNOSIS — J019 Acute sinusitis, unspecified: Secondary | ICD-10-CM | POA: Diagnosis not present

## 2023-03-25 DIAGNOSIS — E538 Deficiency of other specified B group vitamins: Secondary | ICD-10-CM | POA: Diagnosis not present

## 2023-03-26 DIAGNOSIS — R051 Acute cough: Secondary | ICD-10-CM | POA: Diagnosis not present

## 2023-03-26 DIAGNOSIS — Z20828 Contact with and (suspected) exposure to other viral communicable diseases: Secondary | ICD-10-CM | POA: Diagnosis not present

## 2023-03-26 DIAGNOSIS — J101 Influenza due to other identified influenza virus with other respiratory manifestations: Secondary | ICD-10-CM | POA: Diagnosis not present

## 2023-03-31 DIAGNOSIS — C61 Malignant neoplasm of prostate: Secondary | ICD-10-CM | POA: Diagnosis not present

## 2023-04-06 DIAGNOSIS — Z6828 Body mass index (BMI) 28.0-28.9, adult: Secondary | ICD-10-CM | POA: Diagnosis not present

## 2023-04-06 DIAGNOSIS — R0982 Postnasal drip: Secondary | ICD-10-CM | POA: Diagnosis not present

## 2023-04-06 DIAGNOSIS — R059 Cough, unspecified: Secondary | ICD-10-CM | POA: Diagnosis not present

## 2023-04-07 DIAGNOSIS — C61 Malignant neoplasm of prostate: Secondary | ICD-10-CM | POA: Diagnosis not present

## 2023-04-07 DIAGNOSIS — R972 Elevated prostate specific antigen [PSA]: Secondary | ICD-10-CM | POA: Diagnosis not present

## 2023-04-11 ENCOUNTER — Other Ambulatory Visit: Payer: Self-pay | Admitting: Urology

## 2023-04-11 DIAGNOSIS — C61 Malignant neoplasm of prostate: Secondary | ICD-10-CM

## 2023-04-22 DIAGNOSIS — E538 Deficiency of other specified B group vitamins: Secondary | ICD-10-CM | POA: Diagnosis not present

## 2023-05-02 DIAGNOSIS — Z794 Long term (current) use of insulin: Secondary | ICD-10-CM | POA: Diagnosis not present

## 2023-05-02 DIAGNOSIS — N390 Urinary tract infection, site not specified: Secondary | ICD-10-CM | POA: Diagnosis not present

## 2023-05-02 DIAGNOSIS — J392 Other diseases of pharynx: Secondary | ICD-10-CM | POA: Diagnosis not present

## 2023-05-02 DIAGNOSIS — Z6827 Body mass index (BMI) 27.0-27.9, adult: Secondary | ICD-10-CM | POA: Diagnosis not present

## 2023-05-02 DIAGNOSIS — J309 Allergic rhinitis, unspecified: Secondary | ICD-10-CM | POA: Diagnosis not present

## 2023-05-02 DIAGNOSIS — E1140 Type 2 diabetes mellitus with diabetic neuropathy, unspecified: Secondary | ICD-10-CM | POA: Diagnosis not present

## 2023-05-02 DIAGNOSIS — J3489 Other specified disorders of nose and nasal sinuses: Secondary | ICD-10-CM | POA: Diagnosis not present

## 2023-05-02 DIAGNOSIS — I10 Essential (primary) hypertension: Secondary | ICD-10-CM | POA: Diagnosis not present

## 2023-05-11 ENCOUNTER — Encounter: Payer: Self-pay | Admitting: Urology

## 2023-05-11 DIAGNOSIS — E1142 Type 2 diabetes mellitus with diabetic polyneuropathy: Secondary | ICD-10-CM | POA: Diagnosis not present

## 2023-05-11 DIAGNOSIS — L603 Nail dystrophy: Secondary | ICD-10-CM | POA: Diagnosis not present

## 2023-05-11 DIAGNOSIS — I739 Peripheral vascular disease, unspecified: Secondary | ICD-10-CM | POA: Diagnosis not present

## 2023-05-11 DIAGNOSIS — E1151 Type 2 diabetes mellitus with diabetic peripheral angiopathy without gangrene: Secondary | ICD-10-CM | POA: Diagnosis not present

## 2023-05-20 DIAGNOSIS — E538 Deficiency of other specified B group vitamins: Secondary | ICD-10-CM | POA: Diagnosis not present

## 2023-05-24 ENCOUNTER — Ambulatory Visit
Admission: RE | Admit: 2023-05-24 | Discharge: 2023-05-24 | Disposition: A | Discharge status: Home / Self Care | Payer: Medicare PPO | Source: Ambulatory Visit | Attending: Urology

## 2023-05-24 DIAGNOSIS — C61 Malignant neoplasm of prostate: Secondary | ICD-10-CM | POA: Diagnosis not present

## 2023-05-24 MED ORDER — GADOPICLENOL 0.5 MMOL/ML IV SOLN
9.0000 mL | Freq: Once | INTRAVENOUS | Status: AC | PRN
  Administered 2023-05-24: 9 mL via INTRAVENOUS

## 2023-06-01 DIAGNOSIS — R053 Chronic cough: Secondary | ICD-10-CM | POA: Diagnosis not present

## 2023-06-01 DIAGNOSIS — J320 Chronic maxillary sinusitis: Secondary | ICD-10-CM | POA: Diagnosis not present

## 2023-06-01 DIAGNOSIS — Z03818 Encounter for observation for suspected exposure to other biological agents ruled out: Secondary | ICD-10-CM | POA: Diagnosis not present

## 2023-06-01 DIAGNOSIS — R059 Cough, unspecified: Secondary | ICD-10-CM | POA: Diagnosis not present

## 2023-06-17 DIAGNOSIS — C61 Malignant neoplasm of prostate: Secondary | ICD-10-CM | POA: Diagnosis not present

## 2023-06-17 DIAGNOSIS — R972 Elevated prostate specific antigen [PSA]: Secondary | ICD-10-CM | POA: Diagnosis not present

## 2023-06-20 DIAGNOSIS — E538 Deficiency of other specified B group vitamins: Secondary | ICD-10-CM | POA: Diagnosis not present

## 2023-06-21 DIAGNOSIS — L538 Other specified erythematous conditions: Secondary | ICD-10-CM | POA: Diagnosis not present

## 2023-06-21 DIAGNOSIS — L503 Dermatographic urticaria: Secondary | ICD-10-CM | POA: Diagnosis not present

## 2023-06-21 DIAGNOSIS — L820 Inflamed seborrheic keratosis: Secondary | ICD-10-CM | POA: Diagnosis not present

## 2023-06-21 DIAGNOSIS — B078 Other viral warts: Secondary | ICD-10-CM | POA: Diagnosis not present

## 2023-06-21 DIAGNOSIS — L814 Other melanin hyperpigmentation: Secondary | ICD-10-CM | POA: Diagnosis not present

## 2023-06-21 DIAGNOSIS — D225 Melanocytic nevi of trunk: Secondary | ICD-10-CM | POA: Diagnosis not present

## 2023-06-21 DIAGNOSIS — L821 Other seborrheic keratosis: Secondary | ICD-10-CM | POA: Diagnosis not present

## 2023-06-21 DIAGNOSIS — L2989 Other pruritus: Secondary | ICD-10-CM | POA: Diagnosis not present

## 2023-06-21 DIAGNOSIS — I872 Venous insufficiency (chronic) (peripheral): Secondary | ICD-10-CM | POA: Diagnosis not present

## 2023-06-22 DIAGNOSIS — M67912 Unspecified disorder of synovium and tendon, left shoulder: Secondary | ICD-10-CM | POA: Diagnosis not present

## 2023-07-12 DIAGNOSIS — H04123 Dry eye syndrome of bilateral lacrimal glands: Secondary | ICD-10-CM | POA: Diagnosis not present

## 2023-07-12 DIAGNOSIS — Z961 Presence of intraocular lens: Secondary | ICD-10-CM | POA: Diagnosis not present

## 2023-07-12 DIAGNOSIS — H35033 Hypertensive retinopathy, bilateral: Secondary | ICD-10-CM | POA: Diagnosis not present

## 2023-07-12 DIAGNOSIS — E119 Type 2 diabetes mellitus without complications: Secondary | ICD-10-CM | POA: Diagnosis not present

## 2023-07-20 DIAGNOSIS — E538 Deficiency of other specified B group vitamins: Secondary | ICD-10-CM | POA: Diagnosis not present

## 2023-08-10 ENCOUNTER — Ambulatory Visit: Payer: Self-pay | Admitting: Podiatry

## 2023-08-10 ENCOUNTER — Encounter: Payer: Self-pay | Admitting: Podiatry

## 2023-08-10 DIAGNOSIS — M79671 Pain in right foot: Secondary | ICD-10-CM | POA: Diagnosis not present

## 2023-08-10 DIAGNOSIS — B351 Tinea unguium: Secondary | ICD-10-CM | POA: Diagnosis not present

## 2023-08-10 DIAGNOSIS — M79672 Pain in left foot: Secondary | ICD-10-CM | POA: Diagnosis not present

## 2023-08-10 DIAGNOSIS — I70209 Unspecified atherosclerosis of native arteries of extremities, unspecified extremity: Secondary | ICD-10-CM

## 2023-08-10 DIAGNOSIS — E1151 Type 2 diabetes mellitus with diabetic peripheral angiopathy without gangrene: Secondary | ICD-10-CM | POA: Diagnosis not present

## 2023-08-10 NOTE — Progress Notes (Signed)
 Patient presents for evaluation and treatment of tenderness and some redness around nails feet.  Tenderness around toes with walking and wearing shoes.  Physical exam:  General appearance: Alert, pleasant, and in no acute distress.  Vascular: Pedal pulses: DP 2/4 B/L, PT 0/4 B/L.  Moderate edema lower legs bilaterally.  Capillary refill time immediate  Neurological:    Dermatologic:  Nails thickened, disfigured, discolored 1-5 BL with subungual debris.  Redness and hypertrophic nail folds along nail folds bilaterally but no signs of drainage or infection.  Skin thin and atrophic with no hair growth lower extremity and areas of hyperpigmentation of skin.  Second nail left somewhat lytic to nailbed.  Looks like the nail was damaged and a new nail is growing in under the loose nail plate.  Some dried blood around it but no signs of infection.   Musculoskeletal:     Diagnosis: 1. Painful onychomycotic nails 1 through 5 bilaterally. 2. Pain toes 1 through 5 bilaterally.  Plan: -Debrided onychomycotic nails 1 through 5 bilaterally. -Recommend in the on the second toe on the left he soak it once a day is warm salt water apply some Neosporin and a light dressing to it could be fine after week if not call us  the loose part of the portion of the nail was removed today.    Return 3 months [facility]

## 2023-08-18 DIAGNOSIS — E538 Deficiency of other specified B group vitamins: Secondary | ICD-10-CM | POA: Diagnosis not present

## 2023-09-10 DIAGNOSIS — J069 Acute upper respiratory infection, unspecified: Secondary | ICD-10-CM | POA: Diagnosis not present

## 2023-09-12 DIAGNOSIS — J069 Acute upper respiratory infection, unspecified: Secondary | ICD-10-CM | POA: Diagnosis not present

## 2023-09-19 DIAGNOSIS — E538 Deficiency of other specified B group vitamins: Secondary | ICD-10-CM | POA: Diagnosis not present

## 2023-10-20 DIAGNOSIS — E538 Deficiency of other specified B group vitamins: Secondary | ICD-10-CM | POA: Diagnosis not present

## 2023-11-03 DIAGNOSIS — E039 Hypothyroidism, unspecified: Secondary | ICD-10-CM | POA: Diagnosis not present

## 2023-11-03 DIAGNOSIS — E559 Vitamin D deficiency, unspecified: Secondary | ICD-10-CM | POA: Diagnosis not present

## 2023-11-03 DIAGNOSIS — E782 Mixed hyperlipidemia: Secondary | ICD-10-CM | POA: Diagnosis not present

## 2023-11-03 DIAGNOSIS — E1140 Type 2 diabetes mellitus with diabetic neuropathy, unspecified: Secondary | ICD-10-CM | POA: Diagnosis not present

## 2023-11-03 DIAGNOSIS — I10 Essential (primary) hypertension: Secondary | ICD-10-CM | POA: Diagnosis not present

## 2023-11-03 DIAGNOSIS — D510 Vitamin B12 deficiency anemia due to intrinsic factor deficiency: Secondary | ICD-10-CM | POA: Diagnosis not present

## 2023-11-09 DIAGNOSIS — F324 Major depressive disorder, single episode, in partial remission: Secondary | ICD-10-CM | POA: Diagnosis not present

## 2023-11-09 DIAGNOSIS — Z23 Encounter for immunization: Secondary | ICD-10-CM | POA: Diagnosis not present

## 2023-11-09 DIAGNOSIS — I10 Essential (primary) hypertension: Secondary | ICD-10-CM | POA: Diagnosis not present

## 2023-11-09 DIAGNOSIS — E1140 Type 2 diabetes mellitus with diabetic neuropathy, unspecified: Secondary | ICD-10-CM | POA: Diagnosis not present

## 2023-11-09 DIAGNOSIS — K219 Gastro-esophageal reflux disease without esophagitis: Secondary | ICD-10-CM | POA: Diagnosis not present

## 2023-11-09 DIAGNOSIS — E782 Mixed hyperlipidemia: Secondary | ICD-10-CM | POA: Diagnosis not present

## 2023-11-09 DIAGNOSIS — Z6829 Body mass index (BMI) 29.0-29.9, adult: Secondary | ICD-10-CM | POA: Diagnosis not present

## 2023-11-09 DIAGNOSIS — E039 Hypothyroidism, unspecified: Secondary | ICD-10-CM | POA: Diagnosis not present

## 2023-11-09 DIAGNOSIS — Z0000 Encounter for general adult medical examination without abnormal findings: Secondary | ICD-10-CM | POA: Diagnosis not present

## 2023-11-10 ENCOUNTER — Encounter: Payer: Self-pay | Admitting: Podiatry

## 2023-11-10 ENCOUNTER — Ambulatory Visit (INDEPENDENT_AMBULATORY_CARE_PROVIDER_SITE_OTHER): Admitting: Podiatry

## 2023-11-10 DIAGNOSIS — M79672 Pain in left foot: Secondary | ICD-10-CM

## 2023-11-10 DIAGNOSIS — M79671 Pain in right foot: Secondary | ICD-10-CM | POA: Diagnosis not present

## 2023-11-10 DIAGNOSIS — B351 Tinea unguium: Secondary | ICD-10-CM

## 2023-11-10 NOTE — Progress Notes (Signed)
 Patient presents for evaluation and treatment of tenderness and some redness around nails feet.  Tenderness around toes with walking and wearing shoes.  Physical exam:  General appearance: Alert, pleasant, and in no acute distress.  Vascular: Pedal pulses: DP 2/4 B/L, PT 0/4 B/L.  Moderate edema lower legs bilaterally  Neu  Dermatologic:  Nails thickened, disfigured, discolored 1-5 BL with subungual debris.  Redness and hypertrophic nail folds along nail folds bilaterally but no signs of drainage or infection.  Musculoskeletal:     Diagnosis: 1. Painful onychomycotic nails 1 through 5 bilaterally. 2. Pain toes 1 through 5 bilaterally.  Plan: -Debrided onychomycotic nails 1 through 5 bilaterally.  Sharply debrided nails with nail clipper and reduced with a power bur.  Return 3 months

## 2023-11-21 DIAGNOSIS — E538 Deficiency of other specified B group vitamins: Secondary | ICD-10-CM | POA: Diagnosis not present

## 2023-12-19 DIAGNOSIS — E538 Deficiency of other specified B group vitamins: Secondary | ICD-10-CM | POA: Diagnosis not present

## 2024-01-10 DIAGNOSIS — R972 Elevated prostate specific antigen [PSA]: Secondary | ICD-10-CM | POA: Diagnosis not present

## 2024-01-16 DIAGNOSIS — E538 Deficiency of other specified B group vitamins: Secondary | ICD-10-CM | POA: Diagnosis not present

## 2024-02-09 ENCOUNTER — Ambulatory Visit: Admitting: Podiatry

## 2024-02-09 ENCOUNTER — Encounter: Payer: Self-pay | Admitting: Podiatry

## 2024-02-09 DIAGNOSIS — M79671 Pain in right foot: Secondary | ICD-10-CM

## 2024-02-09 DIAGNOSIS — B351 Tinea unguium: Secondary | ICD-10-CM

## 2024-02-09 DIAGNOSIS — M79672 Pain in left foot: Secondary | ICD-10-CM

## 2024-02-09 NOTE — Progress Notes (Signed)
 Patient presents for evaluation and treatment of tenderness and some redness around nails feet.  Tenderness around toes with walking and wearing shoes.  Physical exam:  General appearance: Alert, pleasant, and in no acute distress.  Vascular: Pedal pulses: DP 2/4 B/L, PT 0/4 B/L. Moderate edema lower legs bilaterally  Neu  Dermatologic:  Nails thickened, disfigured, discolored 1-5 BL with subungual debris.  Redness and hypertrophic nail folds along nail folds bilaterally but no signs of drainage or infection.  Musculoskeletal:     Diagnosis: 1. Painful onychomycotic nails 1 through 5 bilaterally. 2. Pain toes 1 through 5 bilaterally.  Plan: -Debrided onychomycotic nails 1 through 5 bilaterally.  Sharply debrided nails with nail clipper and reduced with a power bur.  Return 3 months [facility]

## 2024-05-09 ENCOUNTER — Ambulatory Visit: Admitting: Podiatry
